# Patient Record
Sex: Male | Born: 1970
Health system: Southern US, Community
[De-identification: ages and names within clinical notes are randomized; demographics above are authoritative.]

## PROBLEM LIST (undated history)

## (undated) DIAGNOSIS — I10 Essential (primary) hypertension: Secondary | ICD-10-CM

## (undated) DIAGNOSIS — E785 Hyperlipidemia, unspecified: Secondary | ICD-10-CM

## (undated) HISTORY — DX: Essential (primary) hypertension: I10

## (undated) HISTORY — DX: Hyperlipidemia, unspecified: E78.5

## (undated) HISTORY — PX: NO PAST SURGERIES: SHX2092

---

## 1998-07-21 ENCOUNTER — Ambulatory Visit (HOSPITAL_COMMUNITY): Admission: RE | Admit: 1998-07-21 | Discharge: 1998-07-21 | Payer: Self-pay | Admitting: Obstetrics & Gynecology

## 1998-08-20 ENCOUNTER — Ambulatory Visit (HOSPITAL_COMMUNITY): Admission: RE | Admit: 1998-08-20 | Discharge: 1998-08-20 | Payer: Self-pay | Admitting: *Deleted

## 1998-09-18 ENCOUNTER — Ambulatory Visit (HOSPITAL_COMMUNITY): Admission: RE | Admit: 1998-09-18 | Discharge: 1998-09-18 | Payer: Self-pay | Admitting: *Deleted

## 2007-09-24 DIAGNOSIS — I10 Essential (primary) hypertension: Secondary | ICD-10-CM | POA: Insufficient documentation

## 2009-06-13 ENCOUNTER — Ambulatory Visit: Payer: Self-pay | Admitting: Gastroenterology

## 2009-07-26 DIAGNOSIS — E7800 Pure hypercholesterolemia, unspecified: Secondary | ICD-10-CM | POA: Insufficient documentation

## 2010-07-26 ENCOUNTER — Ambulatory Visit: Payer: Self-pay | Admitting: Internal Medicine

## 2012-06-03 ENCOUNTER — Ambulatory Visit: Payer: Self-pay

## 2012-06-03 ENCOUNTER — Ambulatory Visit: Payer: Self-pay | Admitting: Family Medicine

## 2012-07-23 ENCOUNTER — Ambulatory Visit: Payer: Self-pay | Admitting: Emergency Medicine

## 2012-07-23 LAB — RAPID INFLUENZA A&B ANTIGENS

## 2012-09-23 ENCOUNTER — Ambulatory Visit: Payer: Self-pay | Admitting: Emergency Medicine

## 2013-03-21 ENCOUNTER — Ambulatory Visit: Payer: Self-pay | Admitting: Physician Assistant

## 2013-08-12 ENCOUNTER — Ambulatory Visit: Payer: Self-pay | Admitting: Physician Assistant

## 2013-08-26 LAB — FECAL OCCULT BLOOD, GUAIAC: Fecal Occult Blood: NEGATIVE

## 2013-11-11 ENCOUNTER — Ambulatory Visit: Payer: Self-pay | Admitting: Family Medicine

## 2014-09-14 LAB — BASIC METABOLIC PANEL
BUN: 14 mg/dL (ref 4–21)
Creatinine: 1.3 mg/dL (ref 0.6–1.3)
GLUCOSE: 114 mg/dL
POTASSIUM: 5 mmol/L (ref 3.4–5.3)
Sodium: 143 mmol/L (ref 137–147)

## 2014-09-14 LAB — CBC AND DIFFERENTIAL
HEMATOCRIT: 47 % (ref 41–53)
Hemoglobin: 16.4 g/dL (ref 13.5–17.5)
Platelets: 307 10*3/uL (ref 150–399)
WBC: 6.7 10^3/mL

## 2014-09-14 LAB — LIPID PANEL
Cholesterol: 202 mg/dL — AB (ref 0–200)
HDL: 34 mg/dL — AB (ref 35–70)
LDL Cholesterol: 136 mg/dL
Triglycerides: 160 mg/dL (ref 40–160)

## 2014-09-14 LAB — HEPATIC FUNCTION PANEL
ALT: 33 U/L (ref 10–40)
AST: 28 U/L (ref 14–40)
Alkaline Phosphatase: 59 U/L (ref 25–125)
BILIRUBIN, TOTAL: 0.4 mg/dL

## 2014-09-14 LAB — TSH: TSH: 1.86 u[IU]/mL (ref 0.41–5.90)

## 2014-10-14 ENCOUNTER — Ambulatory Visit: Payer: Self-pay | Admitting: Family Medicine

## 2015-09-06 ENCOUNTER — Encounter: Payer: Self-pay | Admitting: Family Medicine

## 2015-09-07 DIAGNOSIS — Z87442 Personal history of urinary calculi: Secondary | ICD-10-CM | POA: Insufficient documentation

## 2015-09-13 ENCOUNTER — Encounter: Payer: Self-pay | Admitting: Family Medicine

## 2015-09-13 ENCOUNTER — Ambulatory Visit (INDEPENDENT_AMBULATORY_CARE_PROVIDER_SITE_OTHER): Payer: BLUE CROSS/BLUE SHIELD | Admitting: Family Medicine

## 2015-09-13 VITALS — BP 128/82 | HR 80 | Temp 98.9°F | Resp 16 | Ht 68.0 in | Wt 217.0 lb

## 2015-09-13 DIAGNOSIS — R195 Other fecal abnormalities: Secondary | ICD-10-CM

## 2015-09-13 DIAGNOSIS — Z0000 Encounter for general adult medical examination without abnormal findings: Secondary | ICD-10-CM

## 2015-09-13 DIAGNOSIS — E7800 Pure hypercholesterolemia, unspecified: Secondary | ICD-10-CM | POA: Diagnosis not present

## 2015-09-13 DIAGNOSIS — Z87442 Personal history of urinary calculi: Secondary | ICD-10-CM | POA: Diagnosis not present

## 2015-09-13 DIAGNOSIS — Z23 Encounter for immunization: Secondary | ICD-10-CM | POA: Diagnosis not present

## 2015-09-13 DIAGNOSIS — Z8249 Family history of ischemic heart disease and other diseases of the circulatory system: Secondary | ICD-10-CM | POA: Diagnosis not present

## 2015-09-13 NOTE — Patient Instructions (Signed)

## 2015-09-13 NOTE — Progress Notes (Signed)
Patient ID: Austin Sickles., Austin Bradford   DOB: 1970-11-15, 45 y.o.   MRN: [id number]       Patient: Austin Lorentz., Austin Bradford    DOB: [birth year], 45 y.o.   MRN: [id number] Visit Date: 09/13/2015  Today's Provider: Vernie Murders, PA    Subjective:    Annual physical exam Austin Goya Coe Brooke Bonito. is a 45 y.o. Austin Bradford who presents today for health maintenance and complete physical. He feels well. He reports exercising occasionally. He works out 3 days a week. He reports he is sleeping well. History of hyperlipidemia but has been off the statin for the past several months. Denies side effects of muscle pains. Last: Tdap- 10/14/2005   Review of Systems  Constitutional: Negative.   HENT: Negative.   Eyes: Negative.   Respiratory: Negative.   Cardiovascular: Negative.   Gastrointestinal: Negative.   Endocrine: Negative.   Musculoskeletal: Positive for back pain.  Skin: Negative.   Allergic/Immunologic: Negative.   Neurological: Negative.   Hematological: Negative.   Psychiatric/Behavioral: Negative.     Social History      He  reports that he has never smoked. He has never used smokeless tobacco. He reports that he drinks alcohol. He reports that he does not use illicit drugs.       Social History   Social History  . Marital Status: Married    Spouse Name: N/A  . Number of Children: N/A  . Years of Education: N/A   Social History Main Topics  . Smoking status: Never Smoker   . Smokeless tobacco: Never Used  . Alcohol Use: 0.0 oz/week    0 Standard drinks or equivalent per week     Comment: occasionally  . Drug Use: No  . Sexual Activity: Not Asked   Other Topics Concern  . None   Social History Narrative    Past Medical History  Diagnosis Date  . Hyperlipidemia   . Hypertension      Patient Active Problem List   Diagnosis Date Noted  . H/O renal calculi 09/07/2015  . Hypercholesterolemia without hypertriglyceridemia 07/26/2009  . Essential  (primary) hypertension 09/24/2007    Past Surgical History  Procedure Laterality Date  . No past surgeries      Family History        Family Status  Relation Status Death Age  . Mother Alive   . Father Deceased   . Sister Alive   . Maternal Grandmother Alive   . Maternal Grandfather Alive   . Paternal Grandmother Deceased         His family history includes Breast cancer in his mother; CAD in his maternal grandfather; CVA in his maternal grandmother; Healthy in his sister; Heart disease in his father; Hyperlipidemia in his father; Hypertension in his father; Ovarian cancer in his paternal grandmother; Pancreatitis in his father.    No Known Allergies  Previous Medications   PRAVASTATIN (PRAVACHOL) 20 MG TABLET    Take by mouth. Reported on 09/13/2015    Patient Care Team: Margo Common, PA as PCP - General (Family Medicine)     Objective:   Vitals: BP 128/82 mmHg  Pulse 80  Temp(Src) 98.9 F (37.2 C)  Resp 16  Ht _0  (1.727 m)  Wt 217 lb (98.431 kg)  BMI 33.00 kg/m2    Physical Exam  Constitutional: He is oriented to person, place, and time. He appears well-developed and well-nourished.  HENT:  Head: Normocephalic and atraumatic.  Right Ear: External  ear normal.  Left Ear: External ear normal.  Nose: Nose normal.  Mouth/Throat: Oropharynx is clear and moist.  Eyes: Conjunctivae and EOM are normal. Pupils are equal, round, and reactive to light. Right eye exhibits no discharge.  Neck: Normal range of motion. Neck supple. No tracheal deviation present. No thyromegaly present.  Cardiovascular: Normal rate, regular rhythm, normal heart sounds and intact distal pulses.   No murmur heard. Pulmonary/Chest: Effort normal and breath sounds normal. No respiratory distress. He has no wheezes. He has no rales. He exhibits no tenderness.  Abdominal: Soft. He exhibits no distension and no mass. There is no tenderness. There is no rebound and no guarding.  Genitourinary:  Prostate normal and penis normal. Guaiac positive stool.  Musculoskeletal: Normal range of motion. He exhibits no edema or tenderness.  Lymphadenopathy:    He has no cervical adenopathy.  Neurological: He is alert and oriented to person, place, and time. He has normal reflexes. No cranial nerve deficit. He exhibits normal muscle tone. Coordination normal.  Skin: Skin is warm and dry. No rash noted. No erythema.  Multiple brown freckles on back with a couple of pink to tan lesion 1 cm  In diameter left lower lumbar and mid upper lumbar regions. (Dermatologist is planning excisions).  Psychiatric: He has a normal mood and affect. His behavior is normal. Judgment and thought content normal.   Depression Screen PHQ 2/9 Scores 09/13/2015  PHQ - 2 Score 0    Assessment & Plan:     Routine Health Maintenance and Physical Exam  Exercise Activities and Dietary recommendations Goals    Continue to go to the gym for an hour 3 times a week to use treadmill and lift weights. Encouraged to follow low fat diet and work on some weight loss.      Immunization History  Administered Date(s) Administered  . Tdap 10/14/2005        Discussed health benefits of physical activity, and encouraged him to engage in regular exercise appropriate for his age and condition.    -------------------------------------------------------------------- 1. Annual physical exam Good general health. Will up date Tdap. Given anticipatory guidance regarding future shingles and pneumonia vaccinations. Also, advised normal schedule for colonoscopy is at age 52.  - EKG 12-Lead  2. Hypercholesterolemia without hypertriglyceridemia Has been off the Pravastatin for several months. Denied having any side effects. Continues to exercise 3 times a week. Encouraged to continue low fat diet and try to lose some weight. Recheck labs. - COMPLETE METABOLIC PANEL WITH GFR - Lipid panel - TSH  3. H/O renal calculi Normal urinalysis  today. Rare back ache but no evidence of hematuria recently. Will recheck renal function by CMP. - POCT urinalysis dipstick  4. Family history of MI (myocardial infarction) Father died at age 33 of MI in 23-Sep-2014. This patient's EKG normal without signs of ischemic disease. Will recheck lipids. - EKG 12-Lead  5. Fecal occult blood test positive Denies discomfort with BM or constipation. Occasionally may see some blood on tissue. Will give OC-Light kit to retest at home to confirm Hemoccult results. May need referral for colonoscopy if positive. - CBC with Differential/Platelet  6. Need for Tdap vaccination - Tdap vaccine greater than or equal to 7yo IM

## 2015-09-14 LAB — LIPID PANEL
Chol/HDL Ratio: 7.3 ratio units — ABNORMAL HIGH (ref 0.0–5.0)
Cholesterol, Total: 204 mg/dL — ABNORMAL HIGH (ref 100–199)
HDL: 28 mg/dL — ABNORMAL LOW (ref >39)
LDL CALC: 128 mg/dL — AB (ref 0–99)
Triglycerides: 238 mg/dL — ABNORMAL HIGH (ref 0–149)
VLDL CHOLESTEROL CAL: 48 mg/dL — AB (ref 5–40)

## 2015-09-14 LAB — CBC WITH DIFFERENTIAL/PLATELET
BASOS ABS: 0 10*3/uL (ref 0.0–0.2)
Basos: 0 %
EOS (ABSOLUTE): 0.1 10*3/uL (ref 0.0–0.4)
Eos: 1 %
Hematocrit: 47.2 % (ref 37.5–51.0)
Hemoglobin: 16.4 g/dL (ref 12.6–17.7)
IMMATURE GRANS (ABS): 0 10*3/uL (ref 0.0–0.1)
IMMATURE GRANULOCYTES: 1 %
LYMPHS: 27 %
Lymphocytes Absolute: 2 10*3/uL (ref 0.7–3.1)
MCH: 29.2 pg (ref 26.6–33.0)
MCHC: 34.7 g/dL (ref 31.5–35.7)
MCV: 84 fL (ref 79–97)
Monocytes Absolute: 0.5 10*3/uL (ref 0.1–0.9)
Monocytes: 7 %
NEUTROS PCT: 64 %
Neutrophils Absolute: 4.6 10*3/uL (ref 1.4–7.0)
Platelets: 291 10*3/uL (ref 150–379)
RBC: 5.61 x10E6/uL (ref 4.14–5.80)
RDW: 12.3 % (ref 12.3–15.4)
WBC: 7.3 10*3/uL (ref 3.4–10.8)

## 2015-09-14 LAB — COMPREHENSIVE METABOLIC PANEL
ALK PHOS: 57 IU/L (ref 39–117)
ALT: 17 IU/L (ref 0–44)
AST: 15 IU/L (ref 0–40)
Albumin/Globulin Ratio: 1.6 (ref 1.1–2.5)
Albumin: 4.5 g/dL (ref 3.5–5.5)
BILIRUBIN TOTAL: 0.4 mg/dL (ref 0.0–1.2)
BUN/Creatinine Ratio: 9 (ref 9–20)
BUN: 10 mg/dL (ref 6–24)
CHLORIDE: 102 mmol/L (ref 96–106)
CO2: 27 mmol/L (ref 18–29)
Calcium: 9.8 mg/dL (ref 8.7–10.2)
Creatinine, Ser: 1.1 mg/dL (ref 0.76–1.27)
GFR calc Af Amer: 94 mL/min/{1.73_m2} (ref >59)
GFR calc non Af Amer: 81 mL/min/{1.73_m2} (ref >59)
GLUCOSE: 110 mg/dL — AB (ref 65–99)
Globulin, Total: 2.9 g/dL (ref 1.5–4.5)
Potassium: 4.5 mmol/L (ref 3.5–5.2)
Sodium: 142 mmol/L (ref 134–144)
Total Protein: 7.4 g/dL (ref 6.0–8.5)

## 2015-09-14 LAB — TSH: TSH: 2.7 u[IU]/mL (ref 0.450–4.500)

## 2015-09-20 ENCOUNTER — Telehealth: Payer: Self-pay

## 2015-09-20 NOTE — Telephone Encounter (Signed)
-----   Message from Jodell Cipro [place], [place] sent at 09/20/2015 11:55 AM EST ----- All blood tests essentially normal except cholesterol and triglyceride levels higher than last year. Definitely need statin (Simvastatin 20 mg qd #30 & 3 RF). Recheck blood levels in 3 months.

## 2015-09-20 NOTE — Telephone Encounter (Signed)
LMTCB

## 2015-09-21 NOTE — Telephone Encounter (Signed)
Pt returned call. Thanks TNP °

## 2015-09-21 NOTE — Telephone Encounter (Signed)
Pt returned you call.    ° °Thank sTeri °

## 2015-09-21 NOTE — Telephone Encounter (Signed)
Attempted to contact patient. No answer nor voicemail.  

## 2015-09-25 MED ORDER — SIMVASTATIN 20 MG PO TABS: 20.0000 mg | ORAL_TABLET | Freq: Every day | ORAL | Status: DC

## 2015-09-25 NOTE — Telephone Encounter (Signed)
Pt is returning call.  CB#[phone]/MW

## 2015-09-25 NOTE — Telephone Encounter (Signed)
Patient advised as directed below. Patient verbalized understanding and agrees with plan of care. RX sent to pharmacy. Patient scheduled for a 3 month follow up appointment.

## 2015-11-26 ENCOUNTER — Ambulatory Visit (INDEPENDENT_AMBULATORY_CARE_PROVIDER_SITE_OTHER): Payer: BLUE CROSS/BLUE SHIELD | Admitting: Physician Assistant

## 2015-11-26 ENCOUNTER — Other Ambulatory Visit: Payer: Self-pay

## 2015-11-26 ENCOUNTER — Encounter: Payer: Self-pay | Admitting: Physician Assistant

## 2015-11-26 VITALS — BP 140/90 | HR 115 | Temp 98.2°F | Resp 16 | Wt 216.4 lb

## 2015-11-26 DIAGNOSIS — K429 Umbilical hernia without obstruction or gangrene: Secondary | ICD-10-CM

## 2015-11-26 DIAGNOSIS — K648 Other hemorrhoids: Secondary | ICD-10-CM

## 2015-11-26 DIAGNOSIS — R195 Other fecal abnormalities: Secondary | ICD-10-CM | POA: Diagnosis not present

## 2015-11-26 DIAGNOSIS — R1032 Left lower quadrant pain: Secondary | ICD-10-CM | POA: Diagnosis not present

## 2015-11-26 LAB — POCT URINALYSIS DIPSTICK
BILIRUBIN UA: NEGATIVE
Blood, UA: NEGATIVE
GLUCOSE UA: NEGATIVE
KETONES UA: NEGATIVE
LEUKOCYTES UA: NEGATIVE
Nitrite, UA: NEGATIVE
Protein, UA: NEGATIVE
Spec Grav, UA: <=1.005
Urobilinogen, UA: 0.2
pH, UA: 6.5

## 2015-11-26 LAB — IFOBT (OCCULT BLOOD): IMMUNOLOGICAL FECAL OCCULT BLOOD TEST: POSITIVE

## 2015-11-26 MED ORDER — HYDROCORTISONE ACETATE 25 MG RE SUPP: 25.0000 mg | Freq: Two times a day (BID) | RECTAL | Status: DC

## 2015-11-26 NOTE — Patient Instructions (Addendum)
Hernia, Adult A hernia is the bulging of an organ or tissue through a weak spot in the muscles of the abdomen (abdominal wall). Hernias develop most often near the navel or groin. There are many kinds of hernias. Common kinds include:  Femoral hernia. This kind of hernia develops under the groin in the upper thigh area.  Inguinal hernia. This kind of hernia develops in the groin or scrotum.  Umbilical hernia. This kind of hernia develops near the navel.  Hiatal hernia. This kind of hernia causes part of the stomach to be pushed up into the chest.  Incisional hernia. This kind of hernia bulges through a scar from an abdominal surgery. CAUSES This condition may be caused by:  Heavy lifting.  Coughing over a long period of time.  Straining to have a bowel movement.  An incision made during an abdominal surgery.  A birth defect (congenital defect).  Excess weight or obesity.  Smoking.  Poor nutrition.  Cystic fibrosis.  Excess fluid in the abdomen.  Undescended testicles. SYMPTOMS Symptoms of a hernia include:  A lump on the abdomen. This is the first sign of a hernia. The lump may become more obvious with standing, straining, or coughing. It may get bigger over time if it is not treated or if the condition causing it is not treated.  Pain. A hernia is usually painless, but it may become painful over time if treatment is delayed. The pain is usually dull and may get worse with standing or lifting heavy objects. Sometimes a hernia gets tightly squeezed in the weak spot (strangulated) or stuck there (incarcerated) and causes additional symptoms. These symptoms may include:  Vomiting.  Nausea.  Constipation.  Irritability. DIAGNOSIS A hernia may be diagnosed with:  A physical exam. During the exam your health care provider may ask you to cough or to make a specific movement, because a hernia is usually more visible when you move.  Imaging tests. These can  include:  X-rays.  Ultrasound.  CT scan. TREATMENT A hernia that is small and painless may not need to be treated. A hernia that is large or painful may be treated with surgery. Inguinal hernias may be treated with surgery to prevent incarceration or strangulation. Strangulated hernias are always treated with surgery, because lack of blood to the trapped organ or tissue can cause it to die. Surgery to treat a hernia involves pushing the bulge back into place and repairing the weak part of the abdomen. HOME CARE INSTRUCTIONS  Avoid straining.  Do not lift anything heavier than 10 lb (4.5 kg).  Lift with your leg muscles, not your back muscles. This helps avoid strain.  When coughing, try to cough gently.  Prevent constipation. Constipation leads to straining with bowel movements, which can make a hernia worse or cause a hernia repair to break down. You can prevent constipation by:  Eating a high-fiber diet that includes plenty of fruits and vegetables.  Drinking enough fluids to keep your urine clear or pale yellow. Aim to drink 6-8 glasses of water per day.  Using a stool softener as directed by your health care provider.  Lose weight, if you are overweight.  Do not use any tobacco products, including cigarettes, chewing tobacco, or electronic cigarettes. If you need help quitting, ask your health care provider.  Keep all follow-up visits as directed by your health care provider. This is important. Your health care provider may need to monitor your condition. SEEK MEDICAL CARE IF:  You have   swelling, redness, and pain in the affected area.  Your bowel habits change. SEEK IMMEDIATE MEDICAL CARE IF:  You have a fever.  You have abdominal pain that is getting worse.  You feel nauseous or you vomit.  You cannot push the hernia back in place by gently pressing on it while you are lying down.  The hernia:  Changes in shape or size.  Is stuck outside the  abdomen.  Becomes discolored.  Feels hard or tender.   This information is not intended to replace advice given to you by your health care provider. Make sure you discuss any questions you have with your health care provider.   Document Released: 07/21/2005 Document Revised: 08/11/2014 Document Reviewed: 05/31/2014 Elsevier Interactive Patient Education 2016 [employer]. Hemorrhoids Hemorrhoids are swollen veins around the rectum or anus. There are two types of hemorrhoids:   Internal hemorrhoids. These occur in the veins just inside the rectum. They may poke through to the outside and become irritated and painful.  External hemorrhoids. These occur in the veins outside the anus and can be felt as a painful swelling or hard lump near the anus. CAUSES  Pregnancy.   Obesity.   Constipation or diarrhea.   Straining to have a bowel movement.   Sitting for long periods on the toilet.  Heavy lifting or other activity that caused you to strain.  Anal intercourse. SYMPTOMS   Pain.   Anal itching or irritation.   Rectal bleeding.   Fecal leakage.   Anal swelling.   One or more lumps around the anus.  DIAGNOSIS  Your caregiver may be able to diagnose hemorrhoids by visual examination. Other examinations or tests that may be performed include:   Examination of the rectal area with a gloved hand (digital rectal exam).   Examination of anal canal using a small tube (scope).   A blood test if you have lost a significant amount of blood.  A test to look inside the colon (sigmoidoscopy or colonoscopy). TREATMENT Most hemorrhoids can be treated at home. However, if symptoms do not seem to be getting better or if you have a lot of rectal bleeding, your caregiver may perform a procedure to help make the hemorrhoids get smaller or remove them completely. Possible treatments include:   Placing a rubber band at the base of the hemorrhoid to cut off the circulation  (rubber band ligation).   Injecting a chemical to shrink the hemorrhoid (sclerotherapy).   Using a tool to burn the hemorrhoid (infrared light therapy).   Surgically removing the hemorrhoid (hemorrhoidectomy).   Stapling the hemorrhoid to block blood flow to the tissue (hemorrhoid stapling).  HOME CARE INSTRUCTIONS   Eat foods with fiber, such as whole grains, beans, nuts, fruits, and vegetables. Ask your doctor about taking products with added fiber in them (fibersupplements).  Increase fluid intake. Drink enough water and fluids to keep your urine clear or pale yellow.   Exercise regularly.   Go to the bathroom when you have the urge to have a bowel movement. Do not wait.   Avoid straining to have bowel movements.   Keep the anal area dry and clean. Use wet toilet paper or moist towelettes after a bowel movement.   Medicated creams and suppositories may be used or applied as directed.   Only take over-the-counter or prescription medicines as directed by your caregiver.   Take warm sitz baths for 15-20 minutes, 3-4 times a day to ease pain and discomfort.   Place  ice packs on the hemorrhoids if they are tender and swollen. Using ice packs between sitz baths may be helpful.   Put ice in a plastic bag.   Place a towel between your skin and the bag.   Leave the ice on for 15-20 minutes, 3-4 times a day.   Do not use a donut-shaped pillow or sit on the toilet for long periods. This increases blood pooling and pain.  SEEK MEDICAL CARE IF:  You have increasing pain and swelling that is not controlled by treatment or medicine.  You have uncontrolled bleeding.  You have difficulty or you are unable to have a bowel movement.  You have pain or inflammation outside the area of the hemorrhoids. MAKE SURE YOU:  Understand these instructions.  Will watch your condition.  Will get help right away if you are not doing well or get worse.   This information is  not intended to replace advice given to you by your health care provider. Make sure you discuss any questions you have with your health care provider.   Document Released: 07/18/2000 Document Revised: 07/07/2012 Document Reviewed: 05/25/2012 Elsevier Interactive Patient Education 2016 Else[employer] Inc[employer] Hydrocortisone suppositories What is this medicine? HYDROCORTISONE (hye droe KOR ti sone) is a corticosteroid. It is used to decrease swelling, itching, and pain that is caused by minor skin irritations or by hemorrhoids. This medicine may be used for other purposes; ask your health care provider or pharmacist if you have questions. What should I tell my health care provider before I take this medicine? They need to know if you have any of these conditions: -an unusual or allergic reaction to hydrocortisone, corticosteroids, other medicines, foods, dyes, or preservatives -pregnant or trying to get pregnant -breast-feeding How should I use this medicine? This medicine is for rectal use only. Do not take by mouth. Wash your hands before and after use. Take off the foil wrapping. Wet the tip of the suppository with cold tap water to make it easier to use. Lie on your side with your lower leg straightened out and your upper leg bent forward toward your stomach. Lift upper buttock to expose the rectal area. Apply gentle pressure to insert the suppository completely into the rectum, pointed end first. Hold buttocks together for a few seconds. Remain lying down for about 15 minutes to avoid having the suppository come out. Do not use more often than directed. Talk to your pediatrician regarding the use of this medicine in children. Special care may be needed. Overdosage: If you think you have taken too much of this medicine contact a poison control center or emergency room at once. NOTE: This medicine is only for you. Do not share this medicine with others. What if I miss a dose? If you miss a dose, use it as  soon as you can. If it is almost time for your next dose, use only that dose. Do not use double or extra doses. What may interact with this medicine? Interactions are not expected. Do not use any other rectal products on the affected area without telling your doctor or health care professional. This list may not describe all possible interactions. Give your health care provider a list of all the medicines, herbs, non-prescription drugs, or dietary supplements you use. Also tell them if you smoke, drink alcohol, or use illegal drugs. Some items may interact with your medicine. What should I watch for while using this medicine? Visit your doctor or health care professional for regular checks  on your progress. Tell your doctor or health care professional if your symptoms do not improve after a few days of use. Do not use if there is blood in your stools. If you get any type of infection while using this medicine, you may need to stop using this medicine until our infections clears up. Ask your doctor or health care professional for advice. What side effects may I notice from receiving this medicine? Side effects that you should report to your doctor or health care professional as soon as possible: -bloody or black, tarry stools -painful, red, pus filled blisters in hair follicles -rectal pain, burning or bleeding after use of medicine Side effects that usually do not require medical attention (report to your doctor or health care professional if they continue or are bothersome): -changes in skin color -dry skin -itching or irritation This list may not describe all possible side effects. Call your doctor for medical advice about side effects. You may report side effects to FDA at 1-800-FDA-1088. Where should I keep my medicine? Keep out of the reach of children. Store at room temperature between 20 and 25 degrees C (68 and 77 degrees F). Protect from heat and freezing. Throw away any unused medicine  after the expiration date. NOTE: This sheet is a summary. It may not cover all possible information. If you have questions about this medicine, talk to your doctor, pharmacist, or health care provider.    2016, Elsevier/Gold Standard. (2007-12-03 16:07:24)

## 2015-11-26 NOTE — Progress Notes (Signed)
Patient: [employer] Male    DOB: 07/06/1971   45 y.o.   MRN: [id number] Visit Date: 11/26/2015  Today's Provider: Margaretann Loveless, PA-C   Chief Complaint  Patient presents with  . Abdominal Pain   Subjective:    Abdominal Pain This is a new problem. The current episode started in the past 7 days (over the weekend). The onset quality is sudden. The problem occurs constantly. The problem has been unchanged. The pain is located in the periumbilical region and LLQ (when he bends over is most painful). The pain is mild (it feels more when he push on his stomach). The quality of the pain is dull. Associated symptoms include belching. Pertinent negatives include no constipation, diarrhea (Loose stool), dysuria, fever, headaches, hematuria, nausea or vomiting. The pain is aggravated by belching and certain positions. The pain is relieved by nothing. Treatments tried: Ibuprofen. The treatment provided no relief. There is no history of abdominal surgery, colon cancer, Crohn's disease, gallstones, GERD, irritable bowel syndrome, pancreatitis, PUD or ulcerative colitis.   States that over the weekend he was playing golf and noticed developing increasing abdominal pain just superior to the umbilicus that radiated to LLQ. He did have some back pain as well and thought it may be a kidney stone as he does have a history of these. No urinary symptoms however. No fever. He then noticed this morning he was bent over and had a more severe pain just above the umbilicus and felt a small mass there when he stood up. He massaged the area and it went away as did the pain. He took an IBU and has had no pain since. He has not felt the mass since either. He has never had a colonoscopy.  Of note: at his CPE in 09/2015 he did have a positive fecal occult test with stable hemoglobin. He was given a repeat test to do at home that he never did.      No Known Allergies Previous Medications   PRAVASTATIN (PRAVACHOL) 20 MG TABLET    Take by mouth. Reported on 09/13/2015   SIMVASTATIN (ZOCOR) 20 MG TABLET    Take 1 tablet (20 mg total) by mouth at bedtime.    Review of Systems  Constitutional: Negative for fever, chills, activity change and appetite change.  Respiratory: Negative for cough, chest tightness and shortness of breath.   Cardiovascular: Negative for chest pain.  Gastrointestinal: Positive for abdominal pain. Negative for nausea, vomiting, diarrhea (Loose stool) and constipation.  Genitourinary: Negative for dysuria and hematuria.  Neurological: Negative for dizziness and headaches.    Social History  Substance Use Topics  . Smoking status: Never Smoker   . Smokeless tobacco: Never Used  . Alcohol Use: 0.0 oz/week    0 Standard drinks or equivalent per week     Comment: occasionally   Objective:   BP 140/90 mmHg  Pulse 115  Temp(Src) 98.2 F (36.8 C) (Oral)  Resp 16  Wt 216 lb 6.4 oz (98.158 kg)  Physical Exam  Constitutional: He is oriented to person, place, and time. He appears well-developed and well-nourished. No distress.  Cardiovascular: Normal rate, regular rhythm and normal heart sounds.  Exam reveals no gallop and no friction rub.   No murmur heard. Pulmonary/Chest: Effort normal and breath sounds normal. No respiratory distress. He has no wheezes. He has no rales.  Abdominal: Soft. Normal appearance and bowel sounds are normal. He exhibits no distension and no  mass. There is no hepatosplenomegaly. There is tenderness in the periumbilical area and left lower quadrant. There is no rebound, no guarding and no CVA tenderness.    Neurological: He is alert and oriented to person, place, and time.  Skin: Skin is warm and dry. He is not diaphoretic.  Vitals reviewed.       Assessment & Plan:     1. LLQ pain UA was negative. Fecal occult was again positive in the office. DDx: diverticulitis, internal hemorrhoids with irritation, renal stone. Will  check labs as below and f/u pending lab results. Advised of proper lifting and braces for what I believe to have been a reduced umbilical hernia. Discussed adding fiber and will give anusol-HC for possible internal hemorrhoid. I did discuss with Dortha Kernennis Chrismon, PA-C (Patient's PCP) and he agrees with GI referral for positive fecal occult x 2. He is to call if mass returns, abdominal pain or rectal bleeding worsen. - POCT Urinalysis Dipstick - CBC with Differential - Basic Metabolic Panel (BMET) - C-reactive protein - IFOBT POC (occult bld, rslt in office) - Ambulatory referral to Gastroenterology  2. Positive fecal occult blood test See above medical treatment plan. - Ambulatory referral to Gastroenterology  3. Internal hemorrhoid See above medical treatment plan. - hydrocortisone (ANUSOL-HC) 25 MG suppository; Place 1 suppository (25 mg total) rectally 2 (two) times daily.  Dispense: 12 suppository; Refill: 3  4. Umbilical hernia without obstruction and without gangrene See above medical treatment plan. Will monitor.       Margaretann LovelessJennifer M Dennisha Mouser, PA-C  [facility]

## 2015-11-27 LAB — BASIC METABOLIC PANEL
BUN/Creatinine Ratio: 9 (ref 9–20)
BUN: 9 mg/dL (ref 6–24)
CO2: 25 mmol/L (ref 18–29)
CREATININE: 1 mg/dL (ref 0.76–1.27)
Calcium: 9.9 mg/dL (ref 8.7–10.2)
Chloride: 97 mmol/L (ref 96–106)
GFR calc Af Amer: 105 mL/min/{1.73_m2} (ref >59)
GFR, EST NON AFRICAN AMERICAN: 90 mL/min/{1.73_m2} (ref >59)
Glucose: 115 mg/dL — ABNORMAL HIGH (ref 65–99)
POTASSIUM: 4.4 mmol/L (ref 3.5–5.2)
Sodium: 140 mmol/L (ref 134–144)

## 2015-11-27 LAB — CBC WITH DIFFERENTIAL/PLATELET
Basophils Absolute: 0 10*3/uL (ref 0.0–0.2)
Basos: 0 %
EOS (ABSOLUTE): 0 10*3/uL (ref 0.0–0.4)
EOS: 0 %
HEMATOCRIT: 48.2 % (ref 37.5–51.0)
HEMOGLOBIN: 17.2 g/dL (ref 12.6–17.7)
Immature Grans (Abs): 0 10*3/uL (ref 0.0–0.1)
Immature Granulocytes: 0 %
LYMPHS ABS: 1.5 10*3/uL (ref 0.7–3.1)
Lymphs: 21 %
MCH: 29.3 pg (ref 26.6–33.0)
MCHC: 35.7 g/dL (ref 31.5–35.7)
MCV: 82 fL (ref 79–97)
MONOCYTES: 10 %
MONOS ABS: 0.7 10*3/uL (ref 0.1–0.9)
NEUTROS ABS: 4.9 10*3/uL (ref 1.4–7.0)
Neutrophils: 69 %
Platelets: 293 10*3/uL (ref 150–379)
RBC: 5.88 x10E6/uL — AB (ref 4.14–5.80)
RDW: 12.2 % — AB (ref 12.3–15.4)
WBC: 7.1 10*3/uL (ref 3.4–10.8)

## 2015-11-27 LAB — C-REACTIVE PROTEIN: CRP: 0.7 mg/L (ref 0.0–4.9)

## 2015-11-28 ENCOUNTER — Ambulatory Visit
Admission: EM | Admit: 2015-11-28 | Discharge: 2015-11-28 | Disposition: A | Discharge status: Home / Self Care | Payer: BLUE CROSS/BLUE SHIELD | Attending: Family Medicine | Admitting: Family Medicine

## 2015-11-28 ENCOUNTER — Encounter: Payer: Self-pay | Admitting: Emergency Medicine

## 2015-11-28 DIAGNOSIS — R350 Frequency of micturition: Secondary | ICD-10-CM

## 2015-11-28 LAB — URINALYSIS COMPLETE WITH MICROSCOPIC (ARMC ONLY)
BILIRUBIN URINE: NEGATIVE
Bacteria, UA: NONE SEEN
Glucose, UA: NEGATIVE mg/dL
HGB URINE DIPSTICK: NEGATIVE
LEUKOCYTES UA: NEGATIVE
NITRITE: NEGATIVE
PH: 7 (ref 5.0–8.0)
Protein, ur: NEGATIVE mg/dL
SQUAMOUS EPITHELIAL / LPF: NONE SEEN
Specific Gravity, Urine: 1.01 (ref 1.005–1.030)

## 2015-11-28 LAB — CHLAMYDIA/NGC RT PCR (ARMC ONLY)
Chlamydia Tr: NOT DETECTED
N GONORRHOEAE: NOT DETECTED

## 2015-11-28 MED ORDER — AZITHROMYCIN 500 MG PO TABS
1000.0000 mg | ORAL_TABLET | Freq: Once | ORAL | Status: AC
  Administered 2015-11-28: 1000 mg via ORAL

## 2015-11-28 NOTE — ED Provider Notes (Signed)
CSN: [id number]     Arrival date & time 11/28/15  0846 History   First MD Initiated Contact with Patient 11/28/15 [phone]     Chief Complaint  Patient presents with  . Back Pain   (Consider location/radiation/quality/duration/timing/severity/associated sxs/prior Treatment) HPI Comments: 45 yo male with a 1 month h/o intermittent abdominal pains, discomfort with urination (pressure) and frequent urination. Denies any fevers, chills, vomiting, diarrhea, hematuria, burning with urination, penile discharge. States he would like to get tested for STDs.   The history is provided by the patient.    Past Medical History  Diagnosis Date  . Hyperlipidemia   . Hypertension    Past Surgical History  Procedure Laterality Date  . No past surgeries     Family History  Problem Relation Age of Onset  . Breast cancer Mother     history of  . Hyperlipidemia Father   . Hypertension Father   . Pancreatitis Father   . Heart disease Father   . Healthy Sister   . CVA Maternal Grandmother   . CAD Maternal Grandfather   . Ovarian cancer Paternal Grandmother    Social History  Substance Use Topics  . Smoking status: Never Smoker   . Smokeless tobacco: Never Used  . Alcohol Use: 0.0 oz/week    0 Standard drinks or equivalent per week     Comment: occasionally    Review of Systems  Allergies  Review of patient's allergies indicates no known allergies.  Home Medications   Prior to Admission medications   Medication Sig Start Date End Date Taking? Authorizing Provider  hydrocortisone (ANUSOL-HC) 25 MG suppository Place 1 suppository (25 mg total) rectally 2 (two) times daily. 11/26/15   Margaretann LovelessJennifer M Burnette, PA-C  pravastatin (PRAVACHOL) 20 MG tablet Take by mouth. Reported on 09/13/2015 09/19/14   Historical Provider, MD   Meds Ordered and Administered this Visit   Medications  azithromycin (ZITHROMAX) tablet 1,000 mg (1,000 mg Oral Given 11/28/15 0953)    BP 146/99 mmHg  Pulse 116   Temp(Src) 97.6 F (36.4 C) (Tympanic)  Resp 16  Ht 5\' 7"  (1.702 m)  Wt 210 lb (95.255 kg)  BMI 32.88 kg/m2  SpO2 98% No data found.   Physical Exam  Constitutional: He is oriented to person, place, and time. He appears well-developed and well-nourished. No distress.  HENT:  Head: Normocephalic and atraumatic.  Cardiovascular: Normal rate, regular rhythm, normal heart sounds and intact distal pulses.   No murmur heard. Pulmonary/Chest: Effort normal and breath sounds normal. No respiratory distress. He has no wheezes. He has no rales.  Abdominal: Soft. Bowel sounds are normal. He exhibits no distension and no mass. There is no tenderness. There is no rebound and no guarding.  Neurological: He is alert and oriented to person, place, and time.  Skin: No rash noted. He is not diaphoretic.  Nursing note and vitals reviewed.   ED Course  Procedures (including critical care time)  Labs Review Labs Reviewed  URINALYSIS COMPLETEWITH MICROSCOPIC ([facility] ONLY) - Abnormal; Notable for the following:    Ketones, ur TRACE (*)    All other components within normal limits  CHLAMYDIA/NGC RT PCR ([facility] ONLY)  RPR  HIV ANTIBODY (ROUTINE TESTING)    Imaging Review No results found.   Visual Acuity Review  Right Eye Distance:   Left Eye Distance:   Bilateral Distance:    Right Eye Near:   Left Eye Near:    Bilateral Near:  MDM   1. Urinary frequency    1. Lab results and diagnosis reviewed with patient 2. Check urine GC/chlamydia 3. Patient given zithromax 1gm po x 1 as empiric treatment 4. Recommend supportive treatment with increased water intake 5. Follow-up prn if symptoms worsen or don't improve    Payton Mccallum, MD 11/28/15 1745

## 2015-11-28 NOTE — ED Notes (Addendum)
Patient c/o back pain off and on for a month.  Patient reports pressure when he has to go to the bathroom.  Patient c/o recent abdominal pain.  Patient denies fevers.  Patient reports some nausea.  Patient was seen on 11/26/15 by his PCP for these symptoms.  Blood work was done on 11/26/15 wnl.  PCP has put in a referral to GI.

## 2015-11-29 LAB — RPR: RPR Ser Ql: NONREACTIVE

## 2015-11-29 LAB — HIV ANTIBODY (ROUTINE TESTING W REFLEX): HIV Screen 4th Generation wRfx: NONREACTIVE

## 2015-12-17 ENCOUNTER — Other Ambulatory Visit: Payer: Self-pay

## 2015-12-18 ENCOUNTER — Ambulatory Visit (INDEPENDENT_AMBULATORY_CARE_PROVIDER_SITE_OTHER): Payer: BLUE CROSS/BLUE SHIELD | Admitting: Family Medicine

## 2015-12-18 ENCOUNTER — Encounter: Payer: Self-pay | Admitting: Family Medicine

## 2015-12-18 VITALS — BP 126/88 | HR 88 | Temp 97.9°F | Resp 16 | Wt 215.0 lb

## 2015-12-18 DIAGNOSIS — E7800 Pure hypercholesterolemia, unspecified: Secondary | ICD-10-CM

## 2015-12-18 DIAGNOSIS — I10 Essential (primary) hypertension: Secondary | ICD-10-CM

## 2015-12-18 NOTE — Progress Notes (Signed)
Patient ID: Austin Cane., male   DOB: Jul 07, 1971, 45 y.o.   MRN: [id number]         Patient: Austin Bradford. Male    DOB: 07/12/1971   45 y.o.   MRN: [id number] Visit Date: 12/18/2015  Today's Provider: Dortha Kern, PA   Chief Complaint  Patient presents with  . Hypertension  . Hyperlipidemia   Subjective:    HPI    Hypertension, follow-up:  BP Readings from Last 3 Encounters:  12/18/15 126/88  11/28/15 146/99  11/26/15 140/90    He was last seen for hypertension 3 months ago.  Management since that visit includes None .He reports excellent compliance with treatment. He is not having side effects.  He is exercising. He is adherent to low salt diet.   Outside blood pressures are: Pt reports he does not check his blood pressure at home. He is experiencing none.  Patient denies chest pain, dyspnea, fatigue, lower extremity edema and palpitations.   Cardiovascular risk factors include dyslipidemia.  ------------------------------------------------------------------------    Lipid/Cholesterol, Follow-up:   Last seen for this 3 months ago.  Management since that visit includes Started Simvastatin.  Last Lipid Panel:    Component Value Date/Time   CHOL 204* 09/13/2015 1016   CHOL 202* 09/14/2014   TRIG 238* 09/13/2015 1016   HDL 28* 09/13/2015 1016   HDL 34* 09/14/2014   CHOLHDL 7.3* 09/13/2015 1016   LDLCALC 128* 09/13/2015 1016   LDLCALC 136 09/14/2014    He reports excellent compliance with treatment. He is not having side effects.   Wt Readings from Last 3 Encounters:  12/18/15 215 lb (97.523 kg)  11/28/15 210 lb (95.255 kg)  11/26/15 216 lb 6.4 oz (98.158 kg)    ------------------------------------------------------------------------ Past Medical History  Diagnosis Date  . Hyperlipidemia   . Hypertension    Past Surgical History  Procedure Laterality Date  . No past surgeries     Family History  Problem Relation Age  of Onset  . Breast cancer Mother     history of  . Hyperlipidemia Father   . Hypertension Father   . Pancreatitis Father   . Heart disease Father   . Healthy Sister   . CVA Maternal Grandmother   . CAD Maternal Grandfather   . Ovarian cancer Paternal Grandmother    No Known Allergies Previous Medications   SIMVASTATIN (ZOCOR) 20 MG TABLET    TAKE 1 TABLET (20 MG TOTAL) BY MOUTH AT BEDTIME.   Review of Systems  Constitutional: Negative.   Respiratory: Negative.   Cardiovascular: Negative.   Gastrointestinal: Negative.   Neurological: Negative for dizziness, weakness, light-headedness, numbness and headaches.   Social History  Substance Use Topics  . Smoking status: Never Smoker   . Smokeless tobacco: Never Used  . Alcohol Use: 0.0 oz/week    0 Standard drinks or equivalent per week     Comment: occasionally   Objective:   BP 126/88 mmHg  Pulse 88  Temp(Src) 97.9 F (36.6 C) (Oral)  Resp 16  Wt 215 lb (97.523 kg) Body mass index is 33.67 kg/(m^2).   Physical Exam  Constitutional: He is oriented to person, place, and time. He appears well-developed and well-nourished. No distress.  HENT:  Head: Normocephalic and atraumatic.  Right Ear: Hearing normal.  Left Ear: Hearing normal.  Nose: Nose normal.  Eyes: Conjunctivae and lids are normal. Right eye exhibits no discharge. Left eye exhibits no discharge. No scleral icterus.  Neck: Neck supple.  Cardiovascular: Normal rate and regular rhythm.   Pulmonary/Chest: Effort normal and breath sounds normal. No respiratory distress.  Abdominal: Bowel sounds are normal.  Musculoskeletal: Normal range of motion.  Neurological: He is alert and oriented to person, place, and time.  Skin: Skin is intact. No lesion and no rash noted.  Psychiatric: He has a normal mood and affect. His speech is normal and behavior is normal. Thought content normal.      Assessment & Plan:     1. Essential (primary) hypertension Stable. Had some  elevation in the past but has come under control with diet and exercise changes. Will recheck labs and encouraged to lose some more weight with BMI over 33. Recheck pending reports - CBC with Differential/Platelet  2. Hypercholesterolemia without hypertriglyceridemia Tolerating Simvastatin without side effect. Working to [employer] diet better. Continue present dosage and recheck lab. - Comprehensive metabolic panel - Lipid panel - Hemoglobin A1c       Dortha Kernennis Chrismon, PA  [facility]

## 2015-12-20 LAB — CBC WITH DIFFERENTIAL/PLATELET
Basophils Absolute: 0 10*3/uL (ref 0.0–0.2)
Basos: 0 %
EOS (ABSOLUTE): 0.1 10*3/uL (ref 0.0–0.4)
Eos: 2 %
Hematocrit: 47.9 % (ref 37.5–51.0)
Hemoglobin: 16.8 g/dL (ref 12.6–17.7)
Immature Grans (Abs): 0 10*3/uL (ref 0.0–0.1)
Immature Granulocytes: 0 %
LYMPHS ABS: 1.9 10*3/uL (ref 0.7–3.1)
Lymphs: 35 %
MCH: 29.1 pg (ref 26.6–33.0)
MCHC: 35.1 g/dL (ref 31.5–35.7)
MCV: 83 fL (ref 79–97)
MONOCYTES: 10 %
Monocytes Absolute: 0.6 10*3/uL (ref 0.1–0.9)
NEUTROS ABS: 2.8 10*3/uL (ref 1.4–7.0)
Neutrophils: 53 %
PLATELETS: 273 10*3/uL (ref 150–379)
RBC: 5.78 x10E6/uL (ref 4.14–5.80)
RDW: 12.4 % (ref 12.3–15.4)
WBC: 5.4 10*3/uL (ref 3.4–10.8)

## 2015-12-20 LAB — LIPID PANEL
CHOL/HDL RATIO: 5.8 ratio — AB (ref 0.0–5.0)
Cholesterol, Total: 157 mg/dL (ref 100–199)
HDL: 27 mg/dL — ABNORMAL LOW (ref >39)
LDL CALC: 102 mg/dL — AB (ref 0–99)
Triglycerides: 140 mg/dL (ref 0–149)
VLDL CHOLESTEROL CAL: 28 mg/dL (ref 5–40)

## 2015-12-20 LAB — HEMOGLOBIN A1C
ESTIMATED AVERAGE GLUCOSE: 120 mg/dL
HEMOGLOBIN A1C: 5.8 % — AB (ref 4.8–5.6)

## 2015-12-20 LAB — COMPREHENSIVE METABOLIC PANEL
ALBUMIN: 4.7 g/dL (ref 3.5–5.5)
ALT: 30 IU/L (ref 0–44)
AST: 23 IU/L (ref 0–40)
Albumin/Globulin Ratio: 1.6 (ref 1.2–2.2)
Alkaline Phosphatase: 61 IU/L (ref 39–117)
BUN/Creatinine Ratio: 8 — ABNORMAL LOW (ref 9–20)
BUN: 9 mg/dL (ref 6–24)
Bilirubin Total: 0.6 mg/dL (ref 0.0–1.2)
CO2: 25 mmol/L (ref 18–29)
CREATININE: 1.06 mg/dL (ref 0.76–1.27)
Calcium: 9.7 mg/dL (ref 8.7–10.2)
Chloride: 102 mmol/L (ref 96–106)
GFR calc non Af Amer: 84 mL/min/{1.73_m2} (ref >59)
GFR, EST AFRICAN AMERICAN: 97 mL/min/{1.73_m2} (ref >59)
GLOBULIN, TOTAL: 3 g/dL (ref 1.5–4.5)
GLUCOSE: 98 mg/dL (ref 65–99)
Potassium: 4.7 mmol/L (ref 3.5–5.2)
SODIUM: 144 mmol/L (ref 134–144)
TOTAL PROTEIN: 7.7 g/dL (ref 6.0–8.5)

## 2016-03-07 ENCOUNTER — Encounter: Payer: Self-pay | Admitting: Family Medicine

## 2016-03-09 ENCOUNTER — Other Ambulatory Visit: Payer: Self-pay | Admitting: Family Medicine

## 2016-04-11 ENCOUNTER — Other Ambulatory Visit: Payer: Self-pay | Admitting: Family Medicine

## 2016-04-11 MED ORDER — SIMVASTATIN 20 MG PO TABS: 20.0000 mg | ORAL_TABLET | Freq: Every day | ORAL | 3 refills | Status: DC

## 2016-05-06 ENCOUNTER — Other Ambulatory Visit: Payer: Self-pay

## 2016-05-06 NOTE — Telephone Encounter (Signed)
Advise patient his chart shows we refilled the Simvastatin on 04-11-16 for 90 days with 3 refills and it shows the pharmacy confirmed receipt. He is due for repeat labs.

## 2016-05-06 NOTE — Telephone Encounter (Signed)
[facility] from [facility] requesting a 90 days supply on Simvastatin 20 mg.

## 2016-05-07 NOTE — Telephone Encounter (Signed)
[facility] per tech they did receive RX on 04/11/2016. Pharmacy will add note to remind patient to call us for a follow up appointment.

## 2016-06-24 ENCOUNTER — Encounter: Payer: Self-pay | Admitting: Family Medicine

## 2016-06-24 ENCOUNTER — Ambulatory Visit (INDEPENDENT_AMBULATORY_CARE_PROVIDER_SITE_OTHER): Payer: BLUE CROSS/BLUE SHIELD | Admitting: Family Medicine

## 2016-06-24 VITALS — BP 144/98 | HR 84 | Temp 98.0°F | Resp 16 | Wt 219.6 lb

## 2016-06-24 DIAGNOSIS — I10 Essential (primary) hypertension: Secondary | ICD-10-CM | POA: Diagnosis not present

## 2016-06-24 DIAGNOSIS — E7800 Pure hypercholesterolemia, unspecified: Secondary | ICD-10-CM

## 2016-06-24 MED ORDER — LISINOPRIL 5 MG PO TABS: 5.0000 mg | ORAL_TABLET | Freq: Every day | ORAL | 3 refills | Status: DC

## 2016-06-24 NOTE — Progress Notes (Signed)
Patient: Austin RiseJames Gran[employer]elms Jr. Male    DOB: 06/24/1971   45 y.o.   MRN: [id number] Visit Date: 06/24/2016  Today's Provider: Dortha Kernennis Chrismon, PA   Chief Complaint  Patient presents with  . Hypertension   Subjective:    HPI  Hypertension, follow-up:  BP Readings from Last 3 Encounters:  06/24/16 (!) 144/98  12/18/15 126/88  11/28/15 (!) 146/99    He was last seen for hypertension 6 months ago.  BP at that visit was 126/88. Management changes since that visit include none. He is not having side effects.  He is exercising. He is adherent to low salt diet.   Outside blood pressures are being checked. 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 Provider advised patient to follow up with PCP. 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  He is experiencing headaches.  Patient denies chest pain, irregular heart beat and palpitations.   Cardiovascular risk factors include dyslipidemia and male gender.  Use of agents associated with hypertension: none.     Weight trend: stable Wt Readings from Last 3 Encounters:  06/24/16 219 lb 9.6 oz (99.6 kg)  12/18/15 215 lb (97.5 kg)  11/28/15 210 lb (95.3 kg)    Current diet: in general, a "healthy" diet    ------------------------------------------------------------------------ Past Medical History:  Diagnosis Date  . Hyperlipidemia   . Hypertension    Past Surgical History:  Procedure Laterality Date  . NO PAST SURGERIES     Family History  Problem Relation Age of Onset  . Breast cancer Mother     history of  . Hyperlipidemia Father   . Hypertension Father   . Pancreatitis Father   . Heart disease Father   . Healthy Sister   . CVA Maternal Grandmother   . CAD Maternal Grandfather   . Ovarian cancer Paternal Grandmother    No Known Allergies   Previous Medications   SIMVASTATIN (ZOCOR) 20 MG TABLET    Take 1 tablet (20 mg total) by mouth at bedtime.    Review of Systems    Constitutional: Negative.   Respiratory: Negative.   Cardiovascular: Negative.   Neurological: Positive for headaches.    Social History  Substance Use Topics  . Smoking status: Never Smoker  . Smokeless tobacco: Never Used  . Alcohol use 0.0 oz/week     Comment: occasionally   Objective:   BP (!) 144/98 (BP Location: Right Arm, Patient Position: Sitting, Cuff Size: Normal)   Pulse 84   Temp 98 F (36.7 C) (Oral)   Resp 16   Wt 219 lb 9.6 oz (99.6 kg)   BMI 34.39 kg/m   Physical Exam  Constitutional: He is oriented to person, place, and time. He appears well-developed and well-nourished. No distress.  HENT:  Head: Normocephalic and atraumatic.  Right Ear: Hearing normal.  Left Ear: Hearing normal.  Nose: Nose normal.  Eyes: Conjunctivae and lids are normal. Right eye exhibits no discharge. Left eye exhibits no discharge. No scleral icterus.  Neck: Neck supple.  Cardiovascular: Normal rate and regular rhythm.   Pulmonary/Chest: Effort normal. No respiratory distress.  Abdominal: Soft. Bowel sounds are normal.  Musculoskeletal: Normal range of motion.  Neurological: He is alert and oriented to person, place, and time.  Skin: Skin is intact. No lesion and no rash noted.  Psychiatric: He has a normal mood and affect. His speech is normal and behavior is normal. Thought content normal.  Assessment & Plan:     1. Essential (primary) hypertension Elevation again. Has gained 4 more lbs since last check in may 2017. 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 Will start Lisinopril and recheck labs. Reassess BP in 1 month. - CBC with Differential/Platelet - Comprehensive metabolic panel - Lipid panel - TSH - lisinopril (PRINIVIL,ZESTRIL) 5 MG tablet; Take 1 tablet (5 mg total) by mouth daily.  Dispense: 30 tablet; Refill: 3  2. Hypercholesterolemia without hypertriglyceridemia Trying to follow low fat diet and restarted exercise  program this week. Tolerating simvastatin 20 mg qd without side effects. Will recheck labs and follow up pending reports. - Comprehensive metabolic panel - Lipid panel - TSH

## 2016-06-25 LAB — COMPREHENSIVE METABOLIC PANEL
A/G RATIO: 1.7 (ref 1.2–2.2)
ALT: 33 IU/L (ref 0–44)
AST: 25 IU/L (ref 0–40)
Albumin: 4.6 g/dL (ref 3.5–5.5)
Alkaline Phosphatase: 57 IU/L (ref 39–117)
BILIRUBIN TOTAL: 0.7 mg/dL (ref 0.0–1.2)
BUN/Creatinine Ratio: 12 (ref 9–20)
BUN: 13 mg/dL (ref 6–24)
CALCIUM: 9.6 mg/dL (ref 8.7–10.2)
CHLORIDE: 102 mmol/L (ref 96–106)
CO2: 27 mmol/L (ref 18–29)
Creatinine, Ser: 1.08 mg/dL (ref 0.76–1.27)
GFR, EST AFRICAN AMERICAN: 95 mL/min/{1.73_m2} (ref >59)
GFR, EST NON AFRICAN AMERICAN: 82 mL/min/{1.73_m2} (ref >59)
GLOBULIN, TOTAL: 2.7 g/dL (ref 1.5–4.5)
Glucose: 108 mg/dL — ABNORMAL HIGH (ref 65–99)
POTASSIUM: 4.4 mmol/L (ref 3.5–5.2)
SODIUM: 143 mmol/L (ref 134–144)
TOTAL PROTEIN: 7.3 g/dL (ref 6.0–8.5)

## 2016-06-25 LAB — CBC WITH DIFFERENTIAL/PLATELET
BASOS: 0 %
Basophils Absolute: 0 10*3/uL (ref 0.0–0.2)
EOS (ABSOLUTE): 0.1 10*3/uL (ref 0.0–0.4)
EOS: 1 %
HEMATOCRIT: 48.7 % (ref 37.5–51.0)
Hemoglobin: 16.9 g/dL (ref 12.6–17.7)
IMMATURE GRANULOCYTES: 0 %
Immature Grans (Abs): 0 10*3/uL (ref 0.0–0.1)
Lymphocytes Absolute: 2 10*3/uL (ref 0.7–3.1)
Lymphs: 31 %
MCH: 28.5 pg (ref 26.6–33.0)
MCHC: 34.7 g/dL (ref 31.5–35.7)
MCV: 82 fL (ref 79–97)
MONOS ABS: 0.6 10*3/uL (ref 0.1–0.9)
Monocytes: 10 %
NEUTROS ABS: 3.7 10*3/uL (ref 1.4–7.0)
Neutrophils: 58 %
PLATELETS: 294 10*3/uL (ref 150–379)
RBC: 5.94 x10E6/uL — ABNORMAL HIGH (ref 4.14–5.80)
RDW: 12.4 % (ref 12.3–15.4)
WBC: 6.4 10*3/uL (ref 3.4–10.8)

## 2016-06-25 LAB — LIPID PANEL
CHOL/HDL RATIO: 5.2 ratio — AB (ref 0.0–5.0)
Cholesterol, Total: 166 mg/dL (ref 100–199)
HDL: 32 mg/dL — ABNORMAL LOW (ref >39)
LDL Calculated: 105 mg/dL — ABNORMAL HIGH (ref 0–99)
Triglycerides: 147 mg/dL (ref 0–149)
VLDL Cholesterol Cal: 29 mg/dL (ref 5–40)

## 2016-06-25 LAB — TSH: TSH: 2.72 u[IU]/mL (ref 0.450–4.500)

## 2016-06-27 ENCOUNTER — Telehealth: Payer: Self-pay

## 2016-06-27 NOTE — Telephone Encounter (Signed)
[id number] 

## 2016-06-27 NOTE — Telephone Encounter (Signed)
-----   Message from Tamsen Roersennis E Chrismon, [place]PA sent at 06/27/2016  8:40 AM EST ----- HDL cholesterol slowly improving. Continue cholesterol medication, diet and exercise. Recheck levels in 6 months.

## 2016-07-01 NOTE — Telephone Encounter (Signed)
[id number] 

## 2016-07-01 NOTE — Telephone Encounter (Signed)
Patient advised. Patient will call back to schedule a follow up appointment.

## 2016-07-24 ENCOUNTER — Ambulatory Visit (INDEPENDENT_AMBULATORY_CARE_PROVIDER_SITE_OTHER): Payer: BLUE CROSS/BLUE SHIELD | Admitting: Family Medicine

## 2016-07-24 ENCOUNTER — Encounter: Payer: Self-pay | Admitting: Family Medicine

## 2016-07-24 VITALS — BP 128/84 | HR 80 | Temp 98.9°F | Resp 16 | Wt 217.0 lb

## 2016-07-24 DIAGNOSIS — I10 Essential (primary) hypertension: Secondary | ICD-10-CM | POA: Diagnosis not present

## 2016-07-24 NOTE — Progress Notes (Signed)
Patient: Austin RiseJames [employer] Jr[employer] Male    DOB: 10/05/1970   45 y.o.   MRN: [id number] Visit Date: 07/24/2016  Today's Provider: Dortha Kernennis Kaleigh Spiegelman, PA   Follow up hypertension.  Subjective:    HPI  Hypertension, follow-up:  BP Readings from Last 3 Encounters:  07/24/16 128/84  06/24/16 (!) 144/98  12/18/15 126/88    He was last seen for hypertension 1 months ago.  BP at that visit was 144/98. Management since that visit includes adding lisinopril. He reports good compliance with treatment. He is not having side effects.  He is exercising. He is adherent to low salt diet.   Outside blood pressures are not being checked. Patient denies exertional chest pressure/discomfort, lower extremity edema, palpitations and syncope.   Cardiovascular risk factors include dyslipidemia.     Weight trend: stable Wt Readings from Last 3 Encounters:  07/24/16 217 lb (98.4 kg)  06/24/16 219 lb 9.6 oz (99.6 kg)  12/18/15 215 lb (97.5 kg)    Current diet: well balanced   Past Medical History:  Diagnosis Date  . Hyperlipidemia   . Hypertension    Past Surgical History:  Procedure Laterality Date  . NO PAST SURGERIES     Family History  Problem Relation Age of Onset  . Breast cancer Mother     history of  . Hyperlipidemia Father   . Hypertension Father   . Pancreatitis Father   . Heart disease Father   . Healthy Sister   . CVA Maternal Grandmother   . CAD Maternal Grandfather   . Ovarian cancer Paternal Grandmother      No Known Allergies  Current Outpatient Prescriptions:  .  lisinopril (PRINIVIL,ZESTRIL) 5 MG tablet, Take 1 tablet (5 mg total) by mouth daily., Disp: 30 tablet, Rfl: 3 .  simvastatin (ZOCOR) 20 MG tablet, Take 1 tablet (20 mg total) by mouth at bedtime., Disp: 90 tablet, Rfl: 3  Review of Systems  Constitutional: Negative.   Respiratory: Negative.   Cardiovascular: Negative.   Neurological: Negative.     Social History  Substance Use Topics   . Smoking status: Never Smoker  . Smokeless tobacco: Never Used  . Alcohol use 0.0 oz/week     Comment: occasionally   Objective:   BP 128/84 (BP Location: Right Arm, Patient Position: Sitting, Cuff Size: Large)   Pulse 80   Temp 98.9 F (37.2 C)   Resp 16   Wt 217 lb (98.4 kg)   BMI 33.99 kg/m   Physical Exam  Constitutional: He is oriented to person, place, and time. He appears well-developed and well-nourished. No distress.  HENT:  Head: Normocephalic and atraumatic.  Right Ear: Hearing normal.  Left Ear: Hearing normal.  Nose: Nose normal.  Eyes: Conjunctivae and lids are normal. Right eye exhibits no discharge. Left eye exhibits no discharge. No scleral icterus.  Neck: Neck supple.  Cardiovascular: Normal rate and regular rhythm.   Pulmonary/Chest: Effort normal and breath sounds normal. No respiratory distress.  Abdominal: Soft. Bowel sounds are normal.  Musculoskeletal: Normal range of motion.  Neurological: He is alert and oriented to person, place, and time.  Skin: Skin is intact. No lesion and no rash noted.  Psychiatric: He has a normal mood and affect. His speech is normal and behavior is normal. Thought content normal.      Assessment & Plan:     1. Essential (primary) hypertension Well controlled and tolerating Lisinopril 5 mg qd without side effects. No  dizziness or hypotensive episodes. Recheck in 4-5 months or when scheduled for CPE in January.       Dortha Kernennis Ezekeil Bethel, PA  [facility]

## 2016-10-20 ENCOUNTER — Telehealth: Payer: Self-pay

## 2016-10-20 ENCOUNTER — Other Ambulatory Visit: Payer: Self-pay | Admitting: Family Medicine

## 2016-10-20 DIAGNOSIS — I10 Essential (primary) hypertension: Secondary | ICD-10-CM

## 2016-10-20 MED ORDER — LISINOPRIL 5 MG PO TABS: 5.0000 mg | ORAL_TABLET | Freq: Every day | ORAL | 3 refills | Status: DC

## 2016-10-20 NOTE — Telephone Encounter (Signed)
[facility] lisinopril (PRINIVIL,ZESTRIL) 5 MG tablet

## 2016-10-20 NOTE — Telephone Encounter (Signed)
Done

## 2016-11-12 ENCOUNTER — Other Ambulatory Visit: Payer: Self-pay | Admitting: Family Medicine

## 2016-11-12 DIAGNOSIS — I10 Essential (primary) hypertension: Secondary | ICD-10-CM

## 2016-11-12 NOTE — Telephone Encounter (Signed)
Pt needs refill coming up.  He has changed  pharmacy per insurance  He now uses [facility] now.  Their fax number is [phone].  lisinopril (PRINIVIL,ZESTRIL) 5 MG tablet  simvastatin (ZOCOR) 20 MG tablet  Thanks teri

## 2016-11-13 MED ORDER — LISINOPRIL 5 MG PO TABS: 5.0000 mg | ORAL_TABLET | Freq: Every day | ORAL | 3 refills | Status: DC

## 2016-11-13 MED ORDER — SIMVASTATIN 20 MG PO TABS: 20.0000 mg | ORAL_TABLET | Freq: Every day | ORAL | 3 refills | Status: DC

## 2016-12-02 ENCOUNTER — Encounter: Payer: Self-pay | Admitting: Family Medicine

## 2016-12-02 ENCOUNTER — Ambulatory Visit (INDEPENDENT_AMBULATORY_CARE_PROVIDER_SITE_OTHER): Payer: BLUE CROSS/BLUE SHIELD | Admitting: Family Medicine

## 2016-12-02 VITALS — BP 128/90 | HR 87 | Temp 98.2°F | Ht 67.5 in | Wt 215.8 lb

## 2016-12-02 DIAGNOSIS — Z0000 Encounter for general adult medical examination without abnormal findings: Secondary | ICD-10-CM | POA: Diagnosis not present

## 2016-12-02 DIAGNOSIS — E7800 Pure hypercholesterolemia, unspecified: Secondary | ICD-10-CM

## 2016-12-02 DIAGNOSIS — I10 Essential (primary) hypertension: Secondary | ICD-10-CM

## 2016-12-02 NOTE — Progress Notes (Signed)
Patient: Austin Bradford., Male    DOB: 1970-12-11, 46 y.o.   MRN: [id number] Visit Date: 12/02/2016  Today's Provider: Dortha Kern, PA   Chief Complaint  Patient presents with  . Annual Exam   Subjective:    Annual physical exam Austin Bradford. is a 46 y.o. male who presents today for health maintenance and complete physical. He feels well. He reports exercising 4 times per week. He reports he is sleeping well.  -----------------------------------------------------------------   Review of Systems  Constitutional: Negative.   HENT: Positive for congestion.   Eyes: Negative.   Respiratory: Negative.   Cardiovascular: Negative.   Gastrointestinal: Negative.   Endocrine: Negative.   Genitourinary: Negative.   Musculoskeletal: Negative.   Allergic/Immunologic: Negative.   Neurological: Negative.   Hematological: Negative.   Psychiatric/Behavioral: Negative.     Social History      He  reports that he has never smoked. He has never used smokeless tobacco. He reports that he drinks alcohol. He reports that he does not use drugs.       Social History   Social History  . Marital status: Married    Spouse name: N/A  . Number of children: N/A  . Years of education: N/A   Social History Main Topics  . Smoking status: Never Smoker  . Smokeless tobacco: Never Used  . Alcohol use 0.0 oz/week     Comment: occasionally  . Drug use: No  . Sexual activity: Not Asked   Other Topics Concern  . None   Social History Narrative  . None    Past Medical History:  Diagnosis Date  . Hyperlipidemia   . Hypertension      Patient Active Problem List   Diagnosis Date Noted  . H/O renal calculi 09/07/2015  . Hypercholesterolemia without hypertriglyceridemia 07/26/2009  . Essential (primary) hypertension 09/24/2007    Past Surgical History:  Procedure Laterality Date  . NO PAST SURGERIES      Family History        Family Status  Relation  Status  . Mother Alive  . Father Deceased  . Sister Alive  . Maternal Grandmother Alive  . Maternal Grandfather Alive  . Paternal Grandmother Deceased        His family history includes Breast cancer in his mother; CAD in his maternal grandfather; CVA in his maternal grandmother; Healthy in his sister; Heart disease in his father; Hyperlipidemia in his father; Hypertension in his father; Ovarian cancer in his paternal grandmother; Pancreatitis in his father.     No Known Allergies  Current Outpatient Prescriptions:  .  lisinopril (PRINIVIL,ZESTRIL) 5 MG tablet, Take 1 tablet (5 mg total) by mouth daily., Disp: 90 tablet, Rfl: 3 .  simvastatin (ZOCOR) 20 MG tablet, Take 1 tablet (20 mg total) by mouth at bedtime., Disp: 90 tablet, Rfl: 3   Patient Care Team: Tamsen Roers, PA as PCP - General (Family Medicine)      Objective:   Vitals: BP 128/90 (BP Location: Right Arm, Patient Position: Sitting, Cuff Size: Normal)   Pulse 87   Temp 98.2 F (36.8 C) (Oral)   Ht 5' 7.5" (1.715 m)   Wt 215 lb 12.8 oz (97.9 kg)   SpO2 97%   BMI 33.30 kg/m    Wt Readings from Last 3 Encounters:  12/02/16 215 lb 12.8 oz (97.9 kg)  07/24/16 217 lb (98.4 kg)  06/24/16 219 lb 9.6 oz (99.6 kg)  Vitals:   12/02/16 0853  BP: 128/90  Pulse: 87  Temp: 98.2 F (36.8 C)  TempSrc: Oral  SpO2: 97%  Weight: 215 lb 12.8 oz (97.9 kg)  Height: 5' 7.5" (1.715 m)     Physical Exam  Constitutional: He is oriented to person, place, and time. He appears well-developed and well-nourished.  HENT:  Head: Normocephalic and atraumatic.  Right Ear: External ear normal.  Left Ear: External ear normal.  Nose: Nose normal.  Mouth/Throat: Oropharynx is clear and moist.  Eyes: Conjunctivae and EOM are normal. Pupils are equal, round, and reactive to light. Right eye exhibits no discharge.  Neck: Normal range of motion. Neck supple. No tracheal deviation present. No thyromegaly present.  Cardiovascular:  Normal rate, regular rhythm, normal heart sounds and intact distal pulses.   No murmur heard. Pulmonary/Chest: Effort normal and breath sounds normal. No respiratory distress. He has no wheezes. He has no rales. He exhibits no tenderness.  Abdominal: Soft. Bowel sounds are normal. He exhibits no distension and no mass. There is no tenderness. There is no rebound and no guarding.  Genitourinary: Rectum normal, prostate normal and penis normal.  Musculoskeletal: Normal range of motion. He exhibits no edema or tenderness.  Lymphadenopathy:    He has no cervical adenopathy.  Neurological: He is alert and oriented to person, place, and time. He has normal reflexes. No cranial nerve deficit. He exhibits normal muscle tone. Coordination normal.  Skin: Skin is warm and dry. No rash noted. No erythema.  Psychiatric: He has a normal mood and affect. His behavior is normal. Judgment and thought content normal.    Depression Screen PHQ 2/9 Scores 12/02/2016 09/13/2015  PHQ - 2 Score 0 0  PHQ- 9 Score 0 -    Assessment & Plan:     Routine Health Maintenance and Physical Exam  Exercise Activities and Dietary recommendations Goals    Continue exercise 4 days a week and low fat diet. Drink 6 glasses of water a day.      Immunization History  Administered Date(s) Administered  . Tdap 10/14/2005, 09/13/2015    Health Maintenance  Topic Date Due  . INFLUENZA VACCINE  03/04/2017  . TETANUS/TDAP  09/12/2025  . HIV Screening  Completed     Discussed health benefits of physical activity, and encouraged him to engage in regular exercise appropriate for his age and condition.    -------------------------------------------------------------------- 1. Annual physical exam Good general health. Has lost 4 lbs in the past 6 months with routine exercise and watching diet better. Immunizations up to date. Given anticipatory counseling and recheck routine labs to complete his Biometric Screening Form for  [employer] at work. Recheck pending lab reports. - CBC with Differential/Platelet - Comprehensive metabolic panel - Lipid panel - Hemoglobin A1c  2. Hypercholesterolemia without hypertriglyceridemia Tolerating Simvastatin 20 mg qd and following low fat diet. Exercising 4 day a week and feeling well. Recheck labs and continue same dosage. - Comprehensive metabolic panel - Lipid panel - TSH  3. Essential (primary) hypertension Stable and tolerating Lisinopril 5 mg qd without side effects. No palpitations, dyspnea, swelling or cough. Recheck routine labs and continue present dosage. - CBC with Differential/Platelet - Comprehensive metabolic panel - Lipid panel - TSH    Dortha Kern, PA  [facility]

## 2016-12-02 NOTE — Patient Instructions (Signed)
 Health Maintenance, Male A healthy lifestyle and preventive care is important for your health and wellness. Ask your health care provider about what schedule of regular examinations is right for you. What should I know about weight and diet?  Eat a Healthy Diet  Eat plenty of vegetables, fruits, whole grains, low-fat dairy products, and lean protein.  Do not eat a lot of foods high in solid fats, added sugars, or salt. Maintain a Healthy Weight  Regular exercise can help you achieve or maintain a healthy weight. You should:  Do at least 150 minutes of exercise each week. The exercise should increase your heart rate and make you sweat (moderate-intensity exercise).  Do strength-training exercises at least twice a week. Watch Your Levels of Cholesterol and Blood Lipids  Have your blood tested for lipids and cholesterol every 5 years starting at 46 years of age. If you are at high risk for heart disease, you should start having your blood tested when you are 46 years old. You may need to have your cholesterol levels checked more often if:  Your lipid or cholesterol levels are high.  You are older than 46 years of age.  You are at high risk for heart disease. What should I know about cancer screening? Many types of cancers can be detected early and may often be prevented. Lung Cancer  You should be screened every year for lung cancer if:  You are a current smoker who has smoked for at least 30 years.  You are a former smoker who has quit within the past 15 years.  Talk to your health care provider about your screening options, when you should start screening, and how often you should be screened. Colorectal Cancer  Routine colorectal cancer screening usually begins at 46 years of age and should be repeated every 5-10 years until you are 46 years old. You may need to be screened more often if early forms of precancerous polyps or small growths are found. Your health care provider  may recommend screening at an earlier age if you have risk factors for colon cancer.  Your health care provider may recommend using home test kits to check for hidden blood in the stool.  A small camera at the end of a tube can be used to examine your colon (sigmoidoscopy or colonoscopy). This checks for the earliest forms of colorectal cancer. Prostate and Testicular Cancer  Depending on your age and overall health, your health care provider may do certain tests to screen for prostate and testicular cancer.  Talk to your health care provider about any symptoms or concerns you have about testicular or prostate cancer. Skin Cancer  Check your skin from head to toe regularly.  Tell your health care provider about any new moles or changes in moles, especially if:  There is a change in a mole's size, shape, or color.  You have a mole that is larger than a pencil eraser.  Always use sunscreen. Apply sunscreen liberally and repeat throughout the day.  Protect yourself by wearing long sleeves, pants, a wide-brimmed hat, and sunglasses when outside. What should I know about heart disease, diabetes, and high blood pressure?  If you are 18-39 years of age, have your blood pressure checked every 3-5 years. If you are 40 years of age or older, have your blood pressure checked every year. You should have your blood pressure measured twice-once when you are at a hospital or clinic, and once when you are not at   a hospital or clinic. Record the average of the two measurements. To check your blood pressure when you are not at a hospital or clinic, you can use:  An automated blood pressure machine at a pharmacy.  A home blood pressure monitor.  Talk to your health care provider about your target blood pressure.  If you are between 45-79 years old, ask your health care provider if you should take aspirin to prevent heart disease.  Have regular diabetes screenings by checking your fasting blood sugar  level.  If you are at a normal weight and have a low risk for diabetes, have this test once every three years after the age of 45.  If you are overweight and have a high risk for diabetes, consider being tested at a younger age or more often.  A one-time screening for abdominal aortic aneurysm (AAA) by ultrasound is recommended for men aged 65-75 years who are current or former smokers. What should I know about preventing infection? Hepatitis B  If you have a higher risk for hepatitis B, you should be screened for this virus. Talk with your health care provider to find out if you are at risk for hepatitis B infection. Hepatitis C  Blood testing is recommended for:  Everyone born from 1945 through 1965.  Anyone with known risk factors for hepatitis C. Sexually Transmitted Diseases (STDs)  You should be screened each year for STDs including gonorrhea and chlamydia if:  You are sexually active and are younger than 46 years of age.  You are older than 46 years of age and your health care provider tells you that you are at risk for this type of infection.  Your sexual activity has changed since you were last screened and you are at an increased risk for chlamydia or gonorrhea. Ask your health care provider if you are at risk.  Talk with your health care provider about whether you are at high risk of being infected with HIV. Your health care provider may recommend a prescription medicine to help prevent HIV infection. What else can I do?  Schedule regular health, dental, and eye exams.  Stay current with your vaccines (immunizations).  Do not use any tobacco products, such as cigarettes, chewing tobacco, and e-cigarettes. If you need help quitting, ask your health care provider.  Limit alcohol intake to no more than 2 drinks per day. One drink equals 12 ounces of beer, 5 ounces of wine, or 1 ounces of hard liquor.  Do not use street drugs.  Do not share needles.  Ask your health  care provider for help if you need support or information about quitting drugs.  Tell your health care provider if you often feel depressed.  Tell your health care provider if you have ever been abused or do not feel safe at home. This information is not intended to replace advice given to you by your health care provider. Make sure you discuss any questions you have with your health care provider. Document Released: 01/17/2008 Document Revised: 03/19/2016 Document Reviewed: 04/24/2015 Elsevier Interactive Patient Education  2017 Elsevier Inc.  

## 2016-12-03 LAB — COMPREHENSIVE METABOLIC PANEL
ALBUMIN: 4.4 g/dL (ref 3.5–5.5)
ALT: 33 IU/L (ref 0–44)
AST: 28 IU/L (ref 0–40)
Albumin/Globulin Ratio: 1.7 (ref 1.2–2.2)
Alkaline Phosphatase: 62 IU/L (ref 39–117)
BUN / CREAT RATIO: 9 (ref 9–20)
BUN: 9 mg/dL (ref 6–24)
Bilirubin Total: 0.3 mg/dL (ref 0.0–1.2)
CALCIUM: 9.1 mg/dL (ref 8.7–10.2)
CO2: 26 mmol/L (ref 18–29)
CREATININE: 0.97 mg/dL (ref 0.76–1.27)
Chloride: 101 mmol/L (ref 96–106)
GFR calc Af Amer: 108 mL/min/{1.73_m2} (ref >59)
GFR, EST NON AFRICAN AMERICAN: 93 mL/min/{1.73_m2} (ref >59)
GLOBULIN, TOTAL: 2.6 g/dL (ref 1.5–4.5)
GLUCOSE: 109 mg/dL — AB (ref 65–99)
Potassium: 4.4 mmol/L (ref 3.5–5.2)
SODIUM: 140 mmol/L (ref 134–144)
TOTAL PROTEIN: 7 g/dL (ref 6.0–8.5)

## 2016-12-03 LAB — LIPID PANEL
CHOL/HDL RATIO: 5.6 ratio — AB (ref 0.0–5.0)
CHOLESTEROL TOTAL: 141 mg/dL (ref 100–199)
HDL: 25 mg/dL — ABNORMAL LOW (ref >39)
LDL CALC: 90 mg/dL (ref 0–99)
TRIGLYCERIDES: 128 mg/dL (ref 0–149)
VLDL CHOLESTEROL CAL: 26 mg/dL (ref 5–40)

## 2016-12-03 LAB — TSH: TSH: 2.69 u[IU]/mL (ref 0.450–4.500)

## 2016-12-03 LAB — CBC WITH DIFFERENTIAL/PLATELET
BASOS: 0 %
Basophils Absolute: 0 10*3/uL (ref 0.0–0.2)
EOS (ABSOLUTE): 0.1 10*3/uL (ref 0.0–0.4)
EOS: 2 %
HEMATOCRIT: 45.9 % (ref 37.5–51.0)
Hemoglobin: 15.7 g/dL (ref 13.0–17.7)
Immature Grans (Abs): 0 10*3/uL (ref 0.0–0.1)
Immature Granulocytes: 0 %
LYMPHS ABS: 1.4 10*3/uL (ref 0.7–3.1)
Lymphs: 28 %
MCH: 28.1 pg (ref 26.6–33.0)
MCHC: 34.2 g/dL (ref 31.5–35.7)
MCV: 82 fL (ref 79–97)
MONOS ABS: 0.7 10*3/uL (ref 0.1–0.9)
Monocytes: 13 %
Neutrophils Absolute: 2.8 10*3/uL (ref 1.4–7.0)
Neutrophils: 57 %
Platelets: 271 10*3/uL (ref 150–379)
RBC: 5.58 x10E6/uL (ref 4.14–5.80)
RDW: 12.4 % (ref 12.3–15.4)
WBC: 5 10*3/uL (ref 3.4–10.8)

## 2016-12-03 LAB — HEMOGLOBIN A1C
ESTIMATED AVERAGE GLUCOSE: 117 mg/dL
HEMOGLOBIN A1C: 5.7 % — AB (ref 4.8–5.6)

## 2016-12-04 ENCOUNTER — Telehealth: Payer: Self-pay

## 2016-12-04 NOTE — Telephone Encounter (Signed)
Patient advised.

## 2016-12-04 NOTE — Telephone Encounter (Signed)
-----   Message from Tamsen Roersennis E Chrismon, [place] sent at 12/04/2016 11:12 AM EDT ----- All tests essentially normal except HDL cholesterol very low. Recommend adding Krill Oil (MegaRed) qd to the Simvastatin. Hgb A1C is slightly better and only 1/10th of a point from being in the normal range. Recheck levels in 6 months.

## 2016-12-16 DIAGNOSIS — Z85820 Personal history of malignant melanoma of skin: Secondary | ICD-10-CM | POA: Diagnosis not present

## 2016-12-16 DIAGNOSIS — Z872 Personal history of diseases of the skin and subcutaneous tissue: Secondary | ICD-10-CM | POA: Diagnosis not present

## 2016-12-16 DIAGNOSIS — Z86018 Personal history of other benign neoplasm: Secondary | ICD-10-CM | POA: Diagnosis not present

## 2017-11-16 ENCOUNTER — Ambulatory Visit: Payer: BLUE CROSS/BLUE SHIELD | Admitting: Family Medicine

## 2017-11-16 ENCOUNTER — Encounter: Payer: Self-pay | Admitting: Family Medicine

## 2017-11-16 VITALS — BP 156/90 | HR 94 | Temp 97.6°F | Resp 16 | Wt 200.8 lb

## 2017-11-16 DIAGNOSIS — R1032 Left lower quadrant pain: Secondary | ICD-10-CM

## 2017-11-16 DIAGNOSIS — K5790 Diverticulosis of intestine, part unspecified, without perforation or abscess without bleeding: Secondary | ICD-10-CM | POA: Diagnosis not present

## 2017-11-16 LAB — POCT URINALYSIS DIPSTICK
Bilirubin, UA: NEGATIVE
Glucose, UA: NEGATIVE
Ketones, UA: NEGATIVE
LEUKOCYTES UA: NEGATIVE
NITRITE UA: NEGATIVE
PH UA: 6.5 (ref 5.0–8.0)
PROTEIN UA: NEGATIVE
Spec Grav, UA: <=1.005 — AB (ref 1.010–1.025)
UROBILINOGEN UA: 0.2 U/dL

## 2017-11-16 MED ORDER — CIPROFLOXACIN HCL 500 MG PO TABS: 500.0000 mg | ORAL_TABLET | Freq: Two times a day (BID) | ORAL | 0 refills | Status: DC

## 2017-11-16 MED ORDER — METRONIDAZOLE 500 MG PO TABS: 500.0000 mg | ORAL_TABLET | Freq: Three times a day (TID) | ORAL | 0 refills | Status: DC

## 2017-11-16 NOTE — Patient Instructions (Signed)
Discussed putting bowel to rest for 24-48 hours with clear liquids and soups. Diverticulitis Diverticulitis is when small pockets in your large intestine (colon) get infected or swollen. This causes stomach pain and watery poop (diarrhea). These pouches are called diverticula. They form in people who have a condition called diverticulosis. Follow these instructions at home: Medicines  Take over-the-counter and prescription medicines only as told by your doctor. These include: ? Antibiotics. ? Pain medicines. ? Fiber pills. ? Probiotics. ? Stool softeners.  Do not drive or use heavy machinery while taking prescription pain medicine.  If you were prescribed an antibiotic, take it as told. Do not stop taking it even if you feel better. General instructions  Follow a diet as told by your doctor.  When you feel better, your doctor may tell you to change your diet. You may need to eat a lot of fiber. Fiber makes it easier to poop (have bowel movements). Healthy foods with fiber include: ? Berries. ? Beans. ? Lentils. ? Green vegetables.  Exercise 3 or more times a week. Aim for 30 minutes each time. Exercise enough to sweat and make your heart beat faster.  Keep all follow-up visits as told. This is important. You may need to have an exam of the large intestine. This is called a colonoscopy. Contact a doctor if:  Your pain does not get better.  You have a hard time eating or drinking.  You are not pooping like normal. Get help right away if:  Your pain gets worse.  Your problems do not get better.  Your problems get worse very fast.  You have a fever.  You throw up (vomit) more than one time.  You have poop that is: ? Bloody. ? Black. ? Tarry. Summary  Diverticulitis is when small pockets in your large intestine (colon) get infected or swollen.  Take medicines only as told by your doctor.  Follow a diet as told by your doctor. This information is not intended to  replace advice given to you by your health care provider. Make sure you discuss any questions you have with your health care provider. Document Released: 01/07/2008 Document Revised: 08/07/2016 Document Reviewed: 08/07/2016 Elsevier Interactive Patient Education  2017 [employer]

## 2017-11-16 NOTE — Progress Notes (Signed)
Subjective:     Patient ID: Austin [name], Austin Bradford   DOB: 04/15/1971, 47 y.o.   MRN: [id number] Chief Complaint  Patient presents with  . Abdominal Pain    Patient comes in office today with complaints of lower abdominal pain that started 6 days ago, patient describes pain as a pressure feeling and states that he has had diarrhea and bloating. Patient states that he has been taking otc Ibuprofen for relief.    HPI States usual bowel pattern is 2-3 x day formed to "stringy". Reports colonoscopy in 2017 c/w diverticulosis after prior episode of lower abdominal pain treated as diverticulitis. Past history of kidney stones.  Review of Systems     Objective:   Physical Exam  Constitutional: He appears well-developed and well-nourished. No distress.  Abdominal: Soft. There is no tenderness (Patient localizes to LLQ). There is no guarding.       Assessment:    1. Left lower quadrant pain: will cover for diverticulitis - POCT urinalysis dipstick - ciprofloxacin (CIPRO) 500 MG tablet; Take 1 tablet (500 mg total) by mouth 2 (two) times daily.  Dispense: 14 tablet; Refill: 0 - metroNIDAZOLE (FLAGYL) 500 MG tablet; Take 1 tablet (500 mg total) by mouth 3 (three) times daily.  Dispense: 21 tablet; Refill: 0  2. Diverticulosis    Plan:    Handout provided. Discussed bowel rest for 24-48 hours.

## 2017-11-23 ENCOUNTER — Encounter: Payer: Self-pay | Admitting: Family Medicine

## 2017-11-23 ENCOUNTER — Ambulatory Visit: Payer: BLUE CROSS/BLUE SHIELD | Admitting: Family Medicine

## 2017-11-23 VITALS — BP 136/78 | HR 54 | Temp 98.4°F | Resp 16 | Wt 195.0 lb

## 2017-11-23 DIAGNOSIS — R350 Frequency of micturition: Secondary | ICD-10-CM

## 2017-11-23 DIAGNOSIS — R1032 Left lower quadrant pain: Secondary | ICD-10-CM

## 2017-11-23 LAB — POCT URINALYSIS DIPSTICK
BILIRUBIN UA: NEGATIVE
Glucose, UA: NEGATIVE
Ketones, UA: NEGATIVE
Leukocytes, UA: NEGATIVE
Nitrite, UA: NEGATIVE
Protein, UA: NEGATIVE
Spec Grav, UA: 1.01 (ref 1.010–1.025)
Urobilinogen, UA: 0.2 E.U./dL
pH, UA: 7 (ref 5.0–8.0)

## 2017-11-23 NOTE — Progress Notes (Signed)
Subjective:     Patient ID: Austin [name], male   DOB: 11/08/1970, 47 y.o.   MRN: [id number] Chief Complaint  Patient presents with  . Follow-up    pt is here for follow up of LLQ pain/diverticulosis. He reports that he is is feeling a little better, but still does not feel right. He did start having cold symptoms along with this so it is hard to tell. He has no appetite, watery diarrhea. he has finished the antibiotic.    HPI States cold sx are improving. Reports watery loose stools twice daily, increased urination (no nocturia) in the last 24 hours, and decreased appetite. He completed the last abx this AM. Reports transient fever of 100 degrees last night.  Review of Systems     Objective:   Physical Exam  Constitutional: He appears well-developed and well-nourished. No distress.  Abdominal: Soft. There is no tenderness. There is no guarding.       Assessment:    1. Left lower quadrant pain: improved-Current sx may be ascribable to S.E.'s from medication.  2. Urinary frequency - POCT urinalysis dipstick - Urine Culture    Plan:   Further f/u pending urine culture results.

## 2017-11-23 NOTE — Patient Instructions (Addendum)
Let me know if not feeling better over the next day or two when off the antibiotics. We will cal you with the urine culture results.

## 2017-11-25 LAB — URINE CULTURE: Organism ID, Bacteria: NO GROWTH

## 2017-12-01 ENCOUNTER — Encounter: Payer: Self-pay | Admitting: Family Medicine

## 2017-12-01 ENCOUNTER — Ambulatory Visit: Payer: BLUE CROSS/BLUE SHIELD | Admitting: Family Medicine

## 2017-12-01 VITALS — BP 118/84 | HR 88 | Temp 98.0°F | Resp 14 | Ht 67.0 in | Wt 194.8 lb

## 2017-12-01 DIAGNOSIS — R300 Dysuria: Secondary | ICD-10-CM | POA: Diagnosis not present

## 2017-12-01 LAB — POCT URINALYSIS DIPSTICK
Bilirubin, UA: NEGATIVE
GLUCOSE UA: NEGATIVE
KETONES UA: NEGATIVE
Leukocytes, UA: NEGATIVE
Nitrite, UA: NEGATIVE
Odor: NORMAL
Protein, UA: NEGATIVE
RBC UA: NEGATIVE
SPEC GRAV UA: 1.015 (ref 1.010–1.025)
Urobilinogen, UA: 0.2 E.U./dL
pH, UA: 5 (ref 5.0–8.0)

## 2017-12-01 MED ORDER — DOXYCYCLINE HYCLATE 100 MG PO TABS: 100.0000 mg | ORAL_TABLET | Freq: Two times a day (BID) | ORAL | 0 refills | Status: DC

## 2017-12-01 NOTE — Patient Instructions (Signed)
Let me know if new symptoms or not improved.

## 2017-12-01 NOTE — Progress Notes (Signed)
  Subjective:     Patient ID: Austin Bradford., male   DOB: 10-07-1970, 47 y.o.   MRN: [id number] Chief Complaint  Patient presents with  . Abdominal Pain    RLQ onset 3 weeks and comes and goes with diarrhea   HPI States he has noticed right groin discomfort associated with trouble starting his urinary stream. Recently treated for diverticulitis with transient improvement. States stools are more formed but had one episode of diarrhea yesterday.Reports that he does have another sexual partner but uses condom. States he had negative STD screen earlier this month.  Review of Systems     Objective:   Physical Exam  Constitutional: He appears well-developed and well-nourished. No distress.  Abdominal: There is no tenderness.  Genitourinary:  Genitourinary Comments: Prostate firm but mild discomfort noted by the patient during and after exam       Assessment:    1. Dysuria: will cover for prostatitis - POCT urinalysis dipstick - doxycycline (VIBRA-TABS) 100 MG tablet; Take 1 tablet (100 mg total) by mouth 2 (two) times daily.  Dispense: 20 tablet; Refill: 0    Plan:    Further f/u if not improved or new sx.

## 2017-12-08 ENCOUNTER — Encounter: Payer: BLUE CROSS/BLUE SHIELD | Admitting: Family Medicine

## 2017-12-10 ENCOUNTER — Ambulatory Visit
Admission: RE | Admit: 2017-12-10 | Discharge: 2017-12-10 | Disposition: A | Discharge status: Home / Self Care | Payer: BLUE CROSS/BLUE SHIELD | Source: Ambulatory Visit | Attending: Gastroenterology | Admitting: Gastroenterology

## 2017-12-10 ENCOUNTER — Other Ambulatory Visit: Payer: Self-pay | Admitting: Gastroenterology

## 2017-12-10 DIAGNOSIS — K5730 Diverticulosis of large intestine without perforation or abscess without bleeding: Secondary | ICD-10-CM | POA: Insufficient documentation

## 2017-12-10 DIAGNOSIS — K5792 Diverticulitis of intestine, part unspecified, without perforation or abscess without bleeding: Secondary | ICD-10-CM

## 2017-12-10 DIAGNOSIS — R197 Diarrhea, unspecified: Secondary | ICD-10-CM | POA: Diagnosis not present

## 2017-12-10 DIAGNOSIS — N200 Calculus of kidney: Secondary | ICD-10-CM | POA: Insufficient documentation

## 2017-12-10 DIAGNOSIS — R109 Unspecified abdominal pain: Secondary | ICD-10-CM | POA: Diagnosis not present

## 2017-12-10 MED ORDER — IOPAMIDOL (ISOVUE-300) INJECTION 61%
100.0000 mL | Freq: Once | INTRAVENOUS | Status: AC | PRN
  Administered 2017-12-10: 100 mL via INTRAVENOUS

## 2017-12-18 ENCOUNTER — Ambulatory Visit (INDEPENDENT_AMBULATORY_CARE_PROVIDER_SITE_OTHER): Payer: BLUE CROSS/BLUE SHIELD | Admitting: Family Medicine

## 2017-12-18 ENCOUNTER — Encounter: Payer: Self-pay | Admitting: Family Medicine

## 2017-12-18 VITALS — BP 114/80 | HR 67 | Temp 98.3°F | Ht 68.0 in | Wt 196.8 lb

## 2017-12-18 DIAGNOSIS — K5790 Diverticulosis of intestine, part unspecified, without perforation or abscess without bleeding: Secondary | ICD-10-CM | POA: Diagnosis not present

## 2017-12-18 DIAGNOSIS — I10 Essential (primary) hypertension: Secondary | ICD-10-CM | POA: Diagnosis not present

## 2017-12-18 DIAGNOSIS — Z87442 Personal history of urinary calculi: Secondary | ICD-10-CM

## 2017-12-18 DIAGNOSIS — Z0000 Encounter for general adult medical examination without abnormal findings: Secondary | ICD-10-CM | POA: Diagnosis not present

## 2017-12-18 DIAGNOSIS — E7800 Pure hypercholesterolemia, unspecified: Secondary | ICD-10-CM

## 2017-12-18 NOTE — Progress Notes (Signed)
Patient: Austin Fulfer., Male    DOB: 1970/12/05, 47 y.o.   MRN: [id number] Visit Date: 12/18/2017  Today's Provider: Dortha Kern, PA   Chief Complaint  Patient presents with  . Annual Exam   Subjective:    Annual physical exam Austin Bradford. is a 48 y.o. male who presents today for health maintenance and complete physical. He feels well. He reports exercising 4-5 days per week. He reports he is sleeping well.  -----------------------------------------------------------------   Review of Systems  Constitutional: Negative.   HENT: Negative.   Eyes: Negative.   Respiratory: Negative.   Cardiovascular: Negative.   Gastrointestinal: Negative.   Endocrine: Negative.   Genitourinary: Negative.   Musculoskeletal: Negative.   Skin: Negative.   Allergic/Immunologic: Negative.   Neurological: Negative.   Hematological: Negative.   Psychiatric/Behavioral: Negative.     Social History      He  reports that he has never smoked. He has never used smokeless tobacco. He reports that he drinks alcohol. He reports that he does not use drugs.       Social History   Socioeconomic History  . Marital status: Married    Spouse name: Not on file  . Number of children: Not on file  . Years of education: Not on file  . Highest education level: Not on file  Occupational History  . Not on file  Social Needs  . Financial resource strain: Not on file  . Food insecurity:    Worry: Not on file    Inability: Not on file  . Transportation needs:    Medical: Not on file    Non-medical: Not on file  Tobacco Use  . Smoking status: Never Smoker  . Smokeless tobacco: Never Used  Substance and Sexual Activity  . Alcohol use: Yes    Alcohol/week: 0.0 oz    Comment: occasionally  . Drug use: No  . Sexual activity: Not on file  Lifestyle  . Physical activity:    Days per week: Not on file    Minutes per session: Not on file  . Stress: Not on file    Relationships  . Social connections:    Talks on phone: Not on file    Gets together: Not on file    Attends religious service: Not on file    Active member of club or organization: Not on file    Attends meetings of clubs or organizations: Not on file    Relationship status: Not on file  Other Topics Concern  . Not on file  Social History Narrative  . Not on file   Past Medical History:  Diagnosis Date  . Hyperlipidemia   . Hypertension    Patient Active Problem List   Diagnosis Date Noted  . Diverticulosis 11/16/2017  . H/O renal calculi 09/07/2015  . Hypercholesterolemia without hypertriglyceridemia 07/26/2009  . Essential (primary) hypertension 09/24/2007   Past Surgical History:  Procedure Laterality Date  . NO PAST SURGERIES     Family History        Family Status  Relation Name Status  . Mother  Alive  . Father  Deceased  . Sister  Alive  . MGM  Alive  . MGF  Alive  . PGM  Deceased        His family history includes Breast cancer in his mother; CAD in his maternal grandfather; CVA in his maternal grandmother; Healthy in his sister; Heart disease in his father;  Hyperlipidemia in his father; Hypertension in his father; Ovarian cancer in his paternal grandmother; Pancreatitis in his father.     No Known Allergies  Current Outpatient Medications:  .  lisinopril (PRINIVIL,ZESTRIL) 5 MG tablet, Take 1 tablet (5 mg total) by mouth daily., Disp: 90 tablet, Rfl: 3 .  simvastatin (ZOCOR) 20 MG tablet, Take 1 tablet (20 mg total) by mouth at bedtime., Disp: 90 tablet, Rfl: 3   Patient Care Team: Shakiyah Cirilo, Jodell Cipro, PA as PCP - General (Family Medicine)      Objective:   Vitals: BP 114/80 (BP Location: Right Arm, Patient Position: Sitting, Cuff Size: Normal)   Pulse 67   Temp 98.3 F (36.8 C) (Oral)   Ht  (1.727 m)   Wt 196 lb 12.8 oz (89.3 kg)   SpO2 98%   BMI 29.92 kg/m    Physical Exam  Constitutional: He is oriented to person, place, and time. He  appears well-developed and well-nourished.  HENT:  Head: Normocephalic and atraumatic.  Right Ear: External ear normal.  Left Ear: External ear normal.  Nose: Nose normal.  Mouth/Throat: Oropharynx is clear and moist.  Eyes: Pupils are equal, round, and reactive to light. Conjunctivae and EOM are normal. Right eye exhibits no discharge.  Neck: Normal range of motion. Neck supple. No tracheal deviation present. No thyromegaly present.  Cardiovascular: Normal rate, regular rhythm, normal heart sounds and intact distal pulses.  No murmur heard. Pulmonary/Chest: Effort normal and breath sounds normal. No respiratory distress. He has no wheezes. He has no rales. He exhibits no tenderness.  Abdominal: Soft. He exhibits no distension and no mass. There is no tenderness. There is no rebound and no guarding.  Genitourinary: Rectum normal, prostate normal and penis normal.  Musculoskeletal: Normal range of motion. He exhibits no edema or tenderness.  Lymphadenopathy:    He has no cervical adenopathy.  Neurological: He is alert and oriented to person, place, and time. He has normal reflexes. He displays normal reflexes. No cranial nerve deficit. He exhibits normal muscle tone. Coordination normal.  Skin: Skin is warm and dry. No rash noted. No erythema.  Psychiatric: He has a normal mood and affect. His behavior is normal. Judgment and thought content normal.    Depression Screen PHQ 2/9 Scores 12/18/2017 12/01/2017 12/02/2016 09/13/2015  PHQ - 2 Score 0 0 0 0  PHQ- 9 Score 0 - 0 -   Assessment & Plan:     Routine Health Maintenance and Physical Exam  Exercise Activities and Dietary recommendations Goals    Continue exercise program and high fiber low fat diet.      Immunization History  Administered Date(s) Administered  . Tdap 10/14/2005, 09/13/2015    Health Maintenance  Topic Date Due  . INFLUENZA VACCINE  03/04/2018  . TETANUS/TDAP  09/12/2025  . HIV Screening  Completed    Discussed health benefits of physical activity, and encouraged him to engage in regular exercise appropriate for his age and condition.    -------------------------------------------------------------------- 1. Annual physical exam Good general health. Immunizations up to date. Given anticipatory counseling and will check routine labs. - Lipid Profile - TSH - CBC with Differential - Comprehensive Metabolic Panel (CMET)  2. Essential (primary) hypertension Well controlled and tolerating the Lisinopril 5 mg qd without side effects. Recheck routine labs and continue present dosage. - TSH - CBC with Differential - Comprehensive Metabolic Panel (CMET)  3. Diverticulosis Diagnosed with diverticulitis 12-10-17 in the sigmoid colon and followed by gastroenterologist (Dr.  Croley). Was treated with Cipro and Flagyl for a week. Will finish all the antibiotics today and follow up with GI on 12-24-17. Recheck CBC and CMP. - CBC with Differential - Comprehensive Metabolic Panel (CMET)  4. H/O renal calculi A 6 mm stone in the right kidney found on CT scan done for diverticulitis. No pain or hematuria. Recheck CMP to assess renal function. - Comprehensive Metabolic Panel (CMET)  5. Hypercholesterolemia without hypertriglyceridemia Tolerating the Simvastatin 20 mg qd without side effects. Continues to exercise and follow a low fat diet with high fiber content. Recheck labs and follow up pending reports. - Lipid Profile - TSH - Comprehensive Metabolic Panel (CMET)    Dortha Kern, PA  [facility]

## 2017-12-19 LAB — COMPREHENSIVE METABOLIC PANEL
A/G RATIO: 2 (ref 1.2–2.2)
ALT: 32 IU/L (ref 0–44)
AST: 29 IU/L (ref 0–40)
Albumin: 4.2 g/dL (ref 3.5–5.5)
Alkaline Phosphatase: 50 IU/L (ref 39–117)
BILIRUBIN TOTAL: 0.5 mg/dL (ref 0.0–1.2)
BUN/Creatinine Ratio: 10 (ref 9–20)
BUN: 10 mg/dL (ref 6–24)
CALCIUM: 9.3 mg/dL (ref 8.7–10.2)
CO2: 24 mmol/L (ref 20–29)
Chloride: 103 mmol/L (ref 96–106)
Creatinine, Ser: 1.01 mg/dL (ref 0.76–1.27)
GFR calc Af Amer: 102 mL/min/{1.73_m2} (ref >59)
GFR calc non Af Amer: 88 mL/min/{1.73_m2} (ref >59)
Globulin, Total: 2.1 g/dL (ref 1.5–4.5)
Glucose: 89 mg/dL (ref 65–99)
POTASSIUM: 3.9 mmol/L (ref 3.5–5.2)
Sodium: 141 mmol/L (ref 134–144)
Total Protein: 6.3 g/dL (ref 6.0–8.5)

## 2017-12-19 LAB — CBC WITH DIFFERENTIAL/PLATELET
BASOS ABS: 0 10*3/uL (ref 0.0–0.2)
BASOS: 0 %
EOS (ABSOLUTE): 0.1 10*3/uL (ref 0.0–0.4)
Eos: 2 %
Hematocrit: 43.8 % (ref 37.5–51.0)
Hemoglobin: 15.1 g/dL (ref 13.0–17.7)
IMMATURE GRANULOCYTES: 0 %
Immature Grans (Abs): 0 10*3/uL (ref 0.0–0.1)
LYMPHS: 32 %
Lymphocytes Absolute: 1.6 10*3/uL (ref 0.7–3.1)
MCH: 28.8 pg (ref 26.6–33.0)
MCHC: 34.5 g/dL (ref 31.5–35.7)
MCV: 84 fL (ref 79–97)
MONOS ABS: 0.6 10*3/uL (ref 0.1–0.9)
Monocytes: 12 %
Neutrophils Absolute: 2.6 10*3/uL (ref 1.4–7.0)
Neutrophils: 54 %
PLATELETS: 243 10*3/uL (ref 150–379)
RBC: 5.24 x10E6/uL (ref 4.14–5.80)
RDW: 12.9 % (ref 12.3–15.4)
WBC: 4.9 10*3/uL (ref 3.4–10.8)

## 2017-12-19 LAB — LIPID PANEL
CHOLESTEROL TOTAL: 124 mg/dL (ref 100–199)
Chol/HDL Ratio: 3.9 ratio (ref 0.0–5.0)
HDL: 32 mg/dL — AB (ref >39)
LDL Calculated: 76 mg/dL (ref 0–99)
TRIGLYCERIDES: 81 mg/dL (ref 0–149)
VLDL Cholesterol Cal: 16 mg/dL (ref 5–40)

## 2017-12-19 LAB — TSH: TSH: 2.96 u[IU]/mL (ref 0.450–4.500)

## 2017-12-21 ENCOUNTER — Telehealth: Payer: Self-pay

## 2017-12-21 NOTE — Telephone Encounter (Signed)
-----   Message from Jodell Cipro [place], [place] sent at 12/21/2017  9:54 AM EDT ----- All tests essentially normal. The exercise, diet and Simvastatin is improving good cholesterol levels. Risk ratio is down to 3.9 from 5.6. Continue the present regimen and recheck levels in a year.

## 2017-12-21 NOTE — Telephone Encounter (Signed)
Pt advised.kw 

## 2017-12-23 ENCOUNTER — Other Ambulatory Visit
Admission: RE | Admit: 2017-12-23 | Discharge: 2017-12-23 | Disposition: A | Discharge status: Home / Self Care | Payer: BLUE CROSS/BLUE SHIELD | Source: Ambulatory Visit | Attending: Gastroenterology | Admitting: Gastroenterology

## 2017-12-23 DIAGNOSIS — K5792 Diverticulitis of intestine, part unspecified, without perforation or abscess without bleeding: Secondary | ICD-10-CM | POA: Insufficient documentation

## 2017-12-23 LAB — C DIFFICILE QUICK SCREEN W PCR REFLEX
C DIFFICILE (CDIFF) TOXIN: NEGATIVE
C Diff antigen: NEGATIVE
C Diff interpretation: NOT DETECTED

## 2017-12-24 DIAGNOSIS — R195 Other fecal abnormalities: Secondary | ICD-10-CM | POA: Diagnosis not present

## 2017-12-24 DIAGNOSIS — Z8719 Personal history of other diseases of the digestive system: Secondary | ICD-10-CM | POA: Diagnosis not present

## 2017-12-24 DIAGNOSIS — K5730 Diverticulosis of large intestine without perforation or abscess without bleeding: Secondary | ICD-10-CM | POA: Diagnosis not present

## 2017-12-30 NOTE — Progress Notes (Signed)
12/31/2017 2:08 PM   Austin Rise Cashen Jr. November 12, 1970 [id number]  Referring provider: Tamsen Roers, PA [address] [place], [place] [address]  Chief Complaint  Patient presents with  . Nephrolithiasis    HPI: Patient is a 47 year old Caucasian male who presents as a referral from [employer], [place] for incidental finding of a right nonobstructing renal stone.  Contrast CT on 12/10/2017 noted an adrenal glands are unremarkable. Kidneys are without focal lesion, or hydronephrosis. 6 mm nonobstructive right renal calculus. Bladder is unremarkable.  Patient states that he has been experiencing some intermittent right-sided flank pain that radiates into his right side over the last 3 weeks.  He also noted a difficulty with urination associated with some frequency, but this is diminished.  He has had stones in the past which he had passed spontaneously.  He did state that he has not had any issues with stones for the last 10 to 12 years.  He attributes it to relocating from city water to well water.  He is unsure of his stone composition.  He states he does not recollect whether or not he underwent a metabolic work-up with his urologist Dr. Evelene Croon.   Patient denies any gross hematuria, dysuria or suprapubic/flank pain.  Patient denies any fevers, chills, nausea or vomiting.   He has been taking Advil intermittently for the flank pain which he finds helpful.  He has not noted anything that makes the pain worse.    His UA today is negative.  Patient with a history of diverticulitis.  He is experiencing nocturia x 1, which is baseline.    He does not have a family history of nephrolithiasis or prostate cancer.    PMH: Past Medical History:  Diagnosis Date  . Hyperlipidemia   . Hypertension     Surgical History: Past Surgical History:  Procedure Laterality Date  . NO PAST SURGERIES      Home Medications:  Allergies as of 12/31/2017   No Known Allergies       Medication List        Accurate as of 12/31/17  2:08 PM. Always use your most recent med list.          lisinopril 5 MG tablet Commonly known as:  PRINIVIL,ZESTRIL Take 1 tablet (5 mg total) by mouth daily.   simvastatin 20 MG tablet Commonly known as:  ZOCOR Take 1 tablet (20 mg total) by mouth at bedtime.       Allergies: No Known Allergies  Family History: Family History  Problem Relation Age of Onset  . Breast cancer Mother        history of  . Hyperlipidemia Father   . Hypertension Father   . Pancreatitis Father   . Heart disease Father   . Healthy Sister   . CVA Maternal Grandmother   . CAD Maternal Grandfather   . Ovarian cancer Paternal Grandmother     Social History:  reports that he has never smoked. He has never used smokeless tobacco. He reports that he drinks alcohol. He reports that he does not use drugs.  ROS: UROLOGY Frequent Urination?: No Hard to postpone urination?: No Burning/pain with urination?: No Get up at night to urinate?: Yes Leakage of urine?: No Urine stream starts and stops?: No Trouble starting stream?: Yes Do you have to strain to urinate?: No Blood in urine?: No Urinary tract infection?: No Sexually transmitted disease?: No Injury to kidneys or bladder?: No Painful intercourse?: No  Weak stream?: No Erection problems?: No Penile pain?: No  Gastrointestinal Nausea?: No Vomiting?: No Indigestion/heartburn?: No Diarrhea?: Yes Constipation?: No  Constitutional Fever: No Night sweats?: No Weight loss?: No Fatigue?: No  Skin Skin rash/lesions?: No Itching?: No  Eyes Blurred vision?: No Double vision?: No  Ears/Nose/Throat Sore throat?: No Sinus problems?: No  Hematologic/Lymphatic Swollen glands?: No Easy bruising?: No  Cardiovascular Leg swelling?: No Chest pain?: No  Respiratory Cough?: No Shortness of breath?: No  Endocrine Excessive thirst?: No  Musculoskeletal Back pain?: Yes Joint  pain?: No  Neurological Headaches?: No Dizziness?: No  Psychologic Depression?: No Anxiety?: No  Physical Exam: BP 130/80   Pulse 90   Ht  (1.702 m)   Wt 199 lb 1.6 oz (90.3 kg)   BMI 31.18 kg/m   Constitutional:  Well nourished. Alert and oriented, No acute distress. HEENT: [place] AT, moist mucus membranes.  Trachea midline, no masses. Cardiovascular: No clubbing, cyanosis, or edema. Respiratory: Normal respiratory effort, no increased work of breathing. Skin: No rashes, bruises or suspicious lesions. Lymph: No cervical or inguinal adenopathy. Neurologic: Grossly intact, no focal deficits, moving all 4 extremities. Psychiatric: Normal mood and affect.  Laboratory Data: Lab Results  Component Value Date   WBC 4.9 12/18/2017   HGB 15.1 12/18/2017   HCT 43.8 12/18/2017   MCV 84 12/18/2017   PLT 243 12/18/2017    Lab Results  Component Value Date   CREATININE 1.01 12/18/2017    No results found for: PSA  No results found for: TESTOSTERONE  Lab Results  Component Value Date   HGBA1C 5.7 (H) 12/02/2016    Lab Results  Component Value Date   TSH 2.960 12/18/2017       Component Value Date/Time   CHOL 124 12/18/2017 1003   HDL 32 (L) 12/18/2017 1003   CHOLHDL 3.9 12/18/2017 1003   LDLCALC 76 12/18/2017 1003    Lab Results  Component Value Date   AST 29 12/18/2017   Lab Results  Component Value Date   ALT 32 12/18/2017   No components found for: ALKALINEPHOPHATASE No components found for: BILIRUBINTOTAL  No results found for: ESTRADIOL  Urinalysis Negative.  See Epic.   I have reviewed the labs.   Pertinent Imaging: CLINICAL DATA:  Generalized abdominal pain for 3 weeks. Some diarrhea. Currently being treated for diverticulitis.  EXAM: CT ABDOMEN AND PELVIS WITH CONTRAST  TECHNIQUE: Multidetector CT imaging of the abdomen and pelvis was performed using the standard protocol following bolus administration of intravenous  contrast.  CONTRAST:  ISOVUE-300 IOPAMIDOL (ISOVUE-300) INJECTION 61%  COMPARISON:  None.  FINDINGS: Lower chest: No acute abnormality.  Hepatobiliary: No focal liver abnormality is seen. No gallstones, gallbladder wall thickening, or biliary dilatation.  Pancreas: Unremarkable. No pancreatic ductal dilatation or surrounding inflammatory changes.  Spleen: Normal in size without focal abnormality.  Adrenals/Urinary Tract: Adrenal glands are unremarkable. Kidneys are without focal lesion, or hydronephrosis. 6 mm nonobstructive right renal calculus. Bladder is unremarkable.  Stomach/Bowel: Normal appearance of the stomach, small bowel and appendix. Scattered sigmoid diverticula without evidence of diverticulitis.  Vascular/Lymphatic: No significant vascular findings are present. No enlarged abdominal or pelvic lymph nodes.  Reproductive: Prostate is unremarkable.  Other: No abdominal wall hernia or abnormality. No abdominopelvic ascites.  Musculoskeletal: L5-S1 spondylosis.  IMPRESSION: 6 mm nonobstructive right renal calculus.  Mild sigmoid diverticulosis without evidence of diverticulitis.   Electronically Signed   By: Ted Mcalpine M.D.   On: 12/10/2017 12:30  I have  independently reviewed the films with the patient  Assessment & Plan:    1. Right kidney stone Discussed with the patient that the stone is currently not causing an obstruction, at this time.  He has the option of continue to monitor the stone with serial x-rays, proceed with ESWL or undergo URS/LL/ureteral stent placement, the risks of each of these are explained as well as the success rates of each procedure and post-operative expectations He would like to schedule right ESWL I explained that ESWL is a means of pulverizing urinary stones without surgery using shockwave therapy I discussed the risks involved with ESWL consist of bruising to the skin and kidney region as a  result of the shockwave, possibility of long-term kidney damage, development of high blood pressure and damage to the bowel or long, hematuria, urinary bleeding serious enough to require transfusion or surgical repair or removal of the kidney is rare, rare chance of hematoma formation in the kidney and injuries to the spleen, liver or pancreas.  There is also the risk of urinary tract infection or any infection of the blood system or tissue.    I explained that sometimes the stone fragments can stack up in the ureter like coins, a phenomenon described as "Steinstrasse", that would result in a stent placement and/or URS for further treatment His skin to stone distance is < 15 cm and his HU < 1500 - so the ESWL should have ~80% success rate Advised to contact our office or seek treatment in the ED if becomes febrile or pain/ vomiting are difficult control in order to arrange for emergent/urgent intervention   Return for right ESWL.    Michiel Cowboy, PA-C  [facility] [address]  [address] [place], [place] [address] [phone]

## 2017-12-31 ENCOUNTER — Ambulatory Visit: Payer: BLUE CROSS/BLUE SHIELD | Admitting: Urology

## 2017-12-31 ENCOUNTER — Other Ambulatory Visit: Payer: Self-pay | Admitting: Radiology

## 2017-12-31 ENCOUNTER — Encounter: Payer: Self-pay | Admitting: Urology

## 2017-12-31 VITALS — BP 130/80 | HR 90 | Ht 67.0 in | Wt 199.1 lb

## 2017-12-31 DIAGNOSIS — N200 Calculus of kidney: Secondary | ICD-10-CM

## 2017-12-31 LAB — URINALYSIS, COMPLETE
BILIRUBIN UA: NEGATIVE
GLUCOSE, UA: NEGATIVE
Ketones, UA: NEGATIVE
Leukocytes, UA: NEGATIVE
Nitrite, UA: NEGATIVE
Protein, UA: NEGATIVE
RBC UA: NEGATIVE
Specific Gravity, UA: 1.015 (ref 1.005–1.030)
UUROB: 0.2 mg/dL (ref 0.2–1.0)
pH, UA: 7.5 (ref 5.0–7.5)

## 2018-01-02 LAB — URINE CULTURE: Organism ID, Bacteria: NO GROWTH

## 2018-01-13 MED ORDER — CIPROFLOXACIN HCL 500 MG PO TABS
500.0000 mg | ORAL_TABLET | ORAL | Status: AC
  Administered 2018-01-14: 500 mg via ORAL

## 2018-01-13 MED ORDER — DIAZEPAM 5 MG PO TABS
10.0000 mg | ORAL_TABLET | ORAL | Status: AC
  Administered 2018-01-14: 10 mg via ORAL

## 2018-01-13 MED ORDER — ONDANSETRON HCL 4 MG/2ML IJ SOLN
4.0000 mg | Freq: Once | INTRAMUSCULAR | Status: AC | PRN
  Administered 2018-01-14: 4 mg via INTRAVENOUS

## 2018-01-13 MED ORDER — SODIUM CHLORIDE 0.9 % IV SOLN
INTRAVENOUS | Status: DC
  Administered 2018-01-14: 07:00:00 via INTRAVENOUS

## 2018-01-13 MED ORDER — DIPHENHYDRAMINE HCL 25 MG PO CAPS
25.0000 mg | ORAL_CAPSULE | ORAL | Status: AC
  Administered 2018-01-14: 25 mg via ORAL

## 2018-01-14 ENCOUNTER — Other Ambulatory Visit: Payer: Self-pay

## 2018-01-14 ENCOUNTER — Ambulatory Visit
Admission: RE | Admit: 2018-01-14 | Discharge: 2018-01-14 | Disposition: A | Discharge status: Home / Self Care | Payer: BLUE CROSS/BLUE SHIELD | Source: Ambulatory Visit | Attending: Urology | Admitting: Urology

## 2018-01-14 ENCOUNTER — Encounter: Payer: Self-pay | Admitting: *Deleted

## 2018-01-14 ENCOUNTER — Ambulatory Visit: Payer: BLUE CROSS/BLUE SHIELD

## 2018-01-14 ENCOUNTER — Encounter
Admission: RE | Disposition: A | Discharge status: Home / Self Care | Payer: Self-pay | Source: Ambulatory Visit | Attending: Urology

## 2018-01-14 DIAGNOSIS — E785 Hyperlipidemia, unspecified: Secondary | ICD-10-CM | POA: Insufficient documentation

## 2018-01-14 DIAGNOSIS — N200 Calculus of kidney: Secondary | ICD-10-CM

## 2018-01-14 DIAGNOSIS — N132 Hydronephrosis with renal and ureteral calculous obstruction: Secondary | ICD-10-CM | POA: Insufficient documentation

## 2018-01-14 DIAGNOSIS — I10 Essential (primary) hypertension: Secondary | ICD-10-CM | POA: Diagnosis not present

## 2018-01-14 DIAGNOSIS — Z01818 Encounter for other preprocedural examination: Secondary | ICD-10-CM | POA: Diagnosis not present

## 2018-01-14 HISTORY — PX: EXTRACORPOREAL SHOCK WAVE LITHOTRIPSY: SHX1557

## 2018-01-14 SURGERY — LITHOTRIPSY, ESWL
Anesthesia: Moderate Sedation | Laterality: Right

## 2018-01-14 MED ORDER — ONDANSETRON HCL 4 MG/2ML IJ SOLN
INTRAMUSCULAR | Status: AC
  Administered 2018-01-14: 4 mg via INTRAVENOUS
  Filled 2018-01-14: qty 2

## 2018-01-14 MED ORDER — ALFUZOSIN HCL ER 10 MG PO TB24: 10.0000 mg | ORAL_TABLET | Freq: Every day | ORAL | 0 refills | Status: DC

## 2018-01-14 MED ORDER — DIAZEPAM 5 MG PO TABS
ORAL_TABLET | ORAL | Status: AC
  Administered 2018-01-14: 10 mg via ORAL
  Filled 2018-01-14: qty 2

## 2018-01-14 MED ORDER — DIPHENHYDRAMINE HCL 25 MG PO CAPS
ORAL_CAPSULE | ORAL | Status: AC
  Administered 2018-01-14: 25 mg via ORAL
  Filled 2018-01-14: qty 1

## 2018-01-14 MED ORDER — OXYCODONE-ACETAMINOPHEN 5-325 MG PO TABS: 1.0000 | ORAL_TABLET | Freq: Four times a day (QID) | ORAL | 0 refills | Status: DC | PRN

## 2018-01-14 MED ORDER — CIPROFLOXACIN HCL 500 MG PO TABS
ORAL_TABLET | ORAL | Status: AC
  Administered 2018-01-14: 500 mg via ORAL
  Filled 2018-01-14: qty 1

## 2018-01-14 NOTE — Discharge Instructions (Signed)
Follow [facility] discharge instruction sheet as reviewed.  AMBULATORY SURGERY  DISCHARGE INSTRUCTIONS   1) The drugs that you were given will stay in your system until tomorrow so for the next 24 hours you should not:  A) Drive an automobile B) Make any legal decisions C) Drink any alcoholic beverage   2) You may resume regular meals tomorrow.  Today it is better to start with liquids and gradually work up to solid foods.  You may eat anything you prefer, but it is better to start with liquids, then soup and crackers, and gradually work up to solid foods.   3) Please notify your doctor immediately if you have any unusual bleeding, trouble breathing, redness and pain at the surgery site, drainage, fever, or pain not relieved by medication.    4) Additional Instructions:        Please contact your physician with any problems or Same Day Surgery at [phone], Monday through Friday 6 am to 4 pm, or [place] at [facility] Main number at [phone].

## 2018-01-18 ENCOUNTER — Other Ambulatory Visit: Payer: Self-pay | Admitting: Family Medicine

## 2018-01-18 DIAGNOSIS — I10 Essential (primary) hypertension: Secondary | ICD-10-CM

## 2018-01-21 ENCOUNTER — Ambulatory Visit: Payer: BLUE CROSS/BLUE SHIELD | Admitting: Urology

## 2018-01-25 ENCOUNTER — Ambulatory Visit: Payer: BLUE CROSS/BLUE SHIELD | Admitting: Urology

## 2018-01-30 NOTE — Progress Notes (Signed)
02/01/2018 10:49 AM   Austin Bradford. [birth year] [id number]  Referring provider: Tamsen Roers, PA [address] [place], [place] [address]  Chief Complaint  Patient presents with  . Nephrolithiasis    2wk post ESWL    HPI: Patient is a 47 year old Caucasian male with a history of a right upper calyceal stone measuring 6 mm in size who underwent ESWL on January 14, 2018 with Dr. Lonna Cobb.  Background history Patient is a 47 year old Caucasian male who presents as a referral from [employer], [place] for incidental finding of a right nonobstructing renal stone.  Contrast CT on 12/10/2017 noted an adrenal glands are unremarkable. Kidneys are without focal lesion, or hydronephrosis. 6 mm nonobstructive right renal calculus. Bladder is unremarkable.  Patient states that he has been experiencing some intermittent right-sided flank pain that radiates into his right side over the last 3 weeks.  He also noted a difficulty with urination associated with some frequency, but this is diminished.  He has had stones in the past which he had passed spontaneously.  He did state that he has not had any issues with stones for the last 10 to 12 years.  He attributes it to relocating from city water to well water. He is unsure of his stone composition.  He states he does not recollect whether or not he underwent a metabolic work-up with his urologist Dr. Evelene Croon.   Patient denies any gross hematuria, dysuria or suprapubic/flank pain.  Patient denies any fevers, chills, nausea or vomiting.   He has been taking Advil intermittently for the flank pain which he finds helpful.  He has not noted anything that makes the pain worse.  His UA today is negative.  Patient with a history of diverticulitis.  He is experiencing nocturia x 1, which is baseline.  He does have a family history of prostate cancer in his paternal uncle.    He underwent right ESWL on January 14, 2018 and experienced a cardiac  arrhythmia during the procedure.  Smudging of the renal stone was noted during the procedure.  His postprocedural course has been as expected and uneventful.    KUB on 02/01/2018 noted question right upper pole renal calculi versus overlying stool projecting over the right twelfth rib  Today, he has been having right sided flank pain that radiates to the right side.  He is also having urinary frequency.  Patient denies any gross hematuria or dysuria.  He is having discomfort in the left abdomen that radiates to the left lower back.  He states is moves around.  Patient denies any fevers, chills, nausea or vomiting.     PMH: Past Medical History:  Diagnosis Date  . Hyperlipidemia   . Hypertension     Surgical History: Past Surgical History:  Procedure Laterality Date  . EXTRACORPOREAL SHOCK WAVE LITHOTRIPSY Right 01/14/2018   Procedure: EXTRACORPOREAL SHOCK WAVE LITHOTRIPSY (ESWL);  Surgeon: Riki Altes, MD;  Location: [facility] ORS;  Service: Urology;  Laterality: Right;  . NO PAST SURGERIES      Home Medications:  Allergies as of 02/01/2018   No Known Allergies     Medication List        Accurate as of 02/01/18 10:49 AM. Always use your most recent med list.          dicyclomine 10 MG capsule Commonly known as:  BENTYL TAKE 1 CAPSULE (10 MG TOTAL) BY MOUTH 4 (FOUR) TIMES DAILY AS NEEDED (FOR ABDOMINAL CRAMPING)  lisinopril 5 MG tablet Commonly known as:  PRINIVIL,ZESTRIL TAKE 1 TABLET BY MOUTH DAILY   simvastatin 20 MG tablet Commonly known as:  ZOCOR TAKE 1 TABLET BY MOUTH AT BEDTIME       Allergies: No Known Allergies  Family History: Family History  Problem Relation Age of Onset  . Breast cancer Mother        history of  . Hyperlipidemia Father   . Hypertension Father   . Pancreatitis Father   . Heart disease Father   . Healthy Sister   . CVA Maternal Grandmother   . CAD Maternal Grandfather   . Ovarian cancer Paternal Grandmother     Social History:   reports that he has never smoked. He has never used smokeless tobacco. He reports that he drinks alcohol. He reports that he does not use drugs.  ROS: UROLOGY Frequent Urination?: No Hard to postpone urination?: No Burning/pain with urination?: No Get up at night to urinate?: No Leakage of urine?: No Urine stream starts and stops?: No Trouble starting stream?: No Do you have to strain to urinate?: No Blood in urine?: No Urinary tract infection?: No Sexually transmitted disease?: No Injury to kidneys or bladder?: No Painful intercourse?: No Weak stream?: No Erection problems?: No Penile pain?: No  Gastrointestinal Nausea?: No Vomiting?: No Indigestion/heartburn?: No Diarrhea?: No Constipation?: No  Constitutional Fever: No Night sweats?: No Weight loss?: No Fatigue?: No  Skin Skin rash/lesions?: No Itching?: No  Eyes Blurred vision?: No Double vision?: No  Ears/Nose/Throat Sore throat?: No Sinus problems?: No  Hematologic/Lymphatic Swollen glands?: No Easy bruising?: No  Cardiovascular Leg swelling?: No Chest pain?: No  Respiratory Cough?: No Shortness of breath?: No  Endocrine Excessive thirst?: No  Musculoskeletal Back pain?: No Joint pain?: No  Neurological Headaches?: No Dizziness?: No  Psychologic Depression?: No Anxiety?: No  Physical Exam: BP 138/78   Pulse 85   Ht 5\' 7"  (1.702 m)   Wt 200 lb (90.7 kg)   BMI 31.32 kg/m   Constitutional: Well nourished. Alert and oriented, No acute distress. HEENT: [place] AT, moist mucus membranes. Trachea midline, no masses. Cardiovascular: No clubbing, cyanosis, or edema. Respiratory: Normal respiratory effort, no increased work of breathing. Skin: No rashes, bruises or suspicious lesions. Lymph: No cervical or inguinal adenopathy. Neurologic: Grossly intact, no focal deficits, moving all 4 extremities. Psychiatric: Normal mood and affect.   Laboratory Data: Lab Results  Component Value  Date   WBC 4.9 12/18/2017   HGB 15.1 12/18/2017   HCT 43.8 12/18/2017   MCV 84 12/18/2017   PLT 243 12/18/2017    Lab Results  Component Value Date   CREATININE 1.01 12/18/2017    No results found for: PSA  No results found for: TESTOSTERONE  Lab Results  Component Value Date   HGBA1C 5.7 (H) 12/02/2016    Lab Results  Component Value Date   TSH 2.960 12/18/2017       Component Value Date/Time   CHOL 124 12/18/2017 1003   HDL 32 (L) 12/18/2017 1003   CHOLHDL 3.9 12/18/2017 1003   LDLCALC 76 12/18/2017 1003    Lab Results  Component Value Date   AST 29 12/18/2017   Lab Results  Component Value Date   ALT 32 12/18/2017   No components found for: ALKALINEPHOPHATASE No components found for: BILIRUBINTOTAL  No results found for: ESTRADIOL  I have reviewed the labs.   Pertinent Imaging: CLINICAL DATA:  47 year old male post lithotripsy 01/14/2018. History of right renal  calculi. Subsequent encounter.  EXAM: ABDOMEN - 1 VIEW  COMPARISON:  01/14/2018 plain film examination.  12/10/2017 CT.  FINDINGS: Question right upper pole renal calculi versus overlying stool projecting over the right twelfth rib.  No ureteral calculi noted.  Normal bowel gas pattern.  Bones unremarkable.  IMPRESSION: Question right upper pole renal calculi versus overlying stool projecting over the right twelfth rib.   Electronically Signed   By: Lacy DuverneySteven  Olson M.D.   On: 02/01/2018 09:29 I have independently reviewed the films with the patient  Assessment & Plan:    1. Right kidney stone S/P right ESWL on 01/14/2018  KUB on 02/01/2018 noted remaining stone fragments in the right upper pole Stone fragments sent for analysis   2. PSA screening NCCN guidelines recommend screening start at age 47  Discussed with the patient the risks/benefit of screening  Patient is interested in screening and will draw a PSA once he has recovered from ESWL  3. Cardiac  arrhythmia May of been the result of the shock wave treatment, but I have advised patient to notify his PCP as I am not familiar with cardiac risk factors and if he will need further work up for this  Return in about 1 week (around 02/08/2018) for KUB and office visit .    Michiel CowboySHANNON Yonatan Guitron, PA-C  [facility] [address]  [address] [place], [place] [address] [phone]

## 2018-02-01 ENCOUNTER — Ambulatory Visit
Admission: RE | Admit: 2018-02-01 | Discharge: 2018-02-01 | Disposition: A | Discharge status: Home / Self Care | Payer: BLUE CROSS/BLUE SHIELD | Source: Ambulatory Visit | Attending: Urology | Admitting: Urology

## 2018-02-01 ENCOUNTER — Ambulatory Visit (INDEPENDENT_AMBULATORY_CARE_PROVIDER_SITE_OTHER): Payer: BLUE CROSS/BLUE SHIELD | Admitting: Urology

## 2018-02-01 ENCOUNTER — Encounter: Payer: Self-pay | Admitting: Urology

## 2018-02-01 VITALS — BP 138/78 | HR 85 | Ht 67.0 in | Wt 200.0 lb

## 2018-02-01 DIAGNOSIS — N200 Calculus of kidney: Secondary | ICD-10-CM | POA: Diagnosis not present

## 2018-02-01 DIAGNOSIS — Z125 Encounter for screening for malignant neoplasm of prostate: Secondary | ICD-10-CM

## 2018-02-01 DIAGNOSIS — Z87442 Personal history of urinary calculi: Secondary | ICD-10-CM | POA: Diagnosis not present

## 2018-02-10 ENCOUNTER — Other Ambulatory Visit: Payer: Self-pay | Admitting: Urology

## 2018-02-15 ENCOUNTER — Ambulatory Visit: Payer: BLUE CROSS/BLUE SHIELD | Admitting: Urology

## 2018-02-16 ENCOUNTER — Encounter: Payer: Self-pay | Admitting: Family Medicine

## 2018-02-17 NOTE — Telephone Encounter (Signed)
This is a patient of Dennis's. It looks like he had some PVCs during his shock-wave therapy for kidney stones. Please advise that these heart beats are not harmful, but they could be a sign of an electrolyte or magnesium abnormality and sometimes requires medications to regular. He needs to schedule o.v. To have evaluated. It's not urgent can wait until dennis gets back next week.

## 2018-03-02 NOTE — Progress Notes (Signed)
03/04/2018 10:37 AM   Austin RiseJames [name] 03/15/1971 [id number]  Referring provider: Tamsen Roershrismon, Dennis E, [place] [address] [place], [place] [address]  Chief Complaint  Patient presents with  . Nephrolithiasis    HPI: Patient is a 47 year old Caucasian male with a history of a right upper calyceal stone measuring 6 mm in size who underwent ESWL on January 14, 2018 with Dr. Lonna CobbStoioff.  Background history Patient is a 47 year old Caucasian male who [place] [name] for incidental finding of a right nonobstructing renal stone.  Contrast CT on 12/10/2017 noted an adrenal glands are unremarkable. Kidneys are without focal lesion, or hydronephrosis. 6 mm nonobstructive right renal calculus. Bladder is unremarkable.  Patient states that he has been experiencing some intermittent right-sided flank pain that radiates into his right side over the last 3 weeks.  He also noted a difficulty with urination associated with some frequency, but this is diminished.  He has had stones in the past which he had passed spontaneously.  He did state that he has not had any issues with stones for the last 10 to 12 years.  He attributes it to relocating from city water to well water.  He is unsure of his stone composition.  He states he does not recollect whether or not he underwent a metabolic work-up with his urologist Dr. [name].   Patient denies any gross hematuria, dysuria or suprapubic/flank pain.  Patient denies any fevers, chills, nausea or vomiting.   He has been taking Advil intermittently for the flank pain which he finds helpful.  He has not noted anything that makes the pain worse.  His UA today is negative.  Patient with a history of diverticulitis.  He is experiencing nocturia x 1, which is baseline.  He does have a family history of prostate cancer in his paternal uncle.    He underwent right ESWL on January 14, 2018 and experienced a cardiac arrhythmia during the  procedure.  Smudging of the renal stone was noted during the procedure.  His postprocedural course has been as expected and uneventful.    KUB on 02/01/2018 noted question right upper pole renal calculi versus overlying stool projecting over the right twelfth rib  KUB on 03/2018 noted large amount of stool noted within the colon particularly on the right make evaluation for renal stone disease difficult. Small density noted over the right upper renal pole is again noted. This could again represent renal stone overlying stool. No other abnormalities identified. Similar findings noted on prior exam.    He is still having some discomfort in the right side.  Patient denies any gross hematuria, dysuria or suprapubic/flank pain.  Patient denies any fevers, chills, nausea or vomiting.   Stone composition was 60% calcium oxalate monohydrate, 37% calcium oxalate dihydrate and 3% calcium phosphate.    PMH: Past Medical History:  Diagnosis Date  . Hyperlipidemia   . Hypertension     Surgical History: Past Surgical History:  Procedure Laterality Date  . EXTRACORPOREAL SHOCK WAVE LITHOTRIPSY Right 01/14/2018   Procedure: EXTRACORPOREAL SHOCK WAVE LITHOTRIPSY (ESWL);  Surgeon: Riki AltesStoioff, Scott C, MD;  Location: [facility] ORS;  Service: Urology;  Laterality: Right;  . NO PAST SURGERIES      Home Medications:  Allergies as of 03/04/2018   No Known Allergies     Medication List        Accurate as of 03/04/18 10:37 AM. Always use your most recent med list.  dicyclomine 10 MG capsule Commonly known as:  BENTYL TAKE 1 CAPSULE (10 MG TOTAL) BY MOUTH 4 (FOUR) TIMES DAILY AS NEEDED (FOR ABDOMINAL CRAMPING)   lisinopril 5 MG tablet Commonly known as:  PRINIVIL,ZESTRIL TAKE 1 TABLET BY MOUTH DAILY   simvastatin 20 MG tablet Commonly known as:  ZOCOR TAKE 1 TABLET BY MOUTH AT BEDTIME       Allergies: No Known Allergies  Family History: Family History  Problem Relation Age of Onset  .  Breast cancer Mother        history of  . Hyperlipidemia Father   . Hypertension Father   . Pancreatitis Father   . Heart disease Father   . Healthy Sister   . CVA Maternal Grandmother   . CAD Maternal Grandfather   . Ovarian cancer Paternal Grandmother     Social History:  reports that he has never smoked. He has never used smokeless tobacco. He reports that he drinks alcohol. He reports that he does not use drugs.  ROS: UROLOGY Frequent Urination?: No Hard to postpone urination?: No Burning/pain with urination?: No Get up at night to urinate?: No Leakage of urine?: No Urine stream starts and stops?: No Trouble starting stream?: No Do you have to strain to urinate?: No Blood in urine?: No Urinary tract infection?: No Sexually transmitted disease?: No Injury to kidneys or bladder?: No Painful intercourse?: No Weak stream?: No Erection problems?: No Penile pain?: No  Gastrointestinal Nausea?: No Vomiting?: No Indigestion/heartburn?: No Diarrhea?: No Constipation?: No  Constitutional Fever: No Night sweats?: No Weight loss?: No Fatigue?: No  Skin Skin rash/lesions?: No Itching?: No  Eyes Blurred vision?: No Double vision?: No  Ears/Nose/Throat Sore throat?: No Sinus problems?: No  Hematologic/Lymphatic Swollen glands?: No Easy bruising?: No  Cardiovascular Leg swelling?: No Chest pain?: No  Respiratory Cough?: No Shortness of breath?: No  Endocrine Excessive thirst?: No  Musculoskeletal Back pain?: Yes Joint pain?: No  Neurological Headaches?: No Dizziness?: No  Psychologic Depression?: No Anxiety?: No  Physical Exam: BP 133/88 (BP Location: Left Arm, Patient Position: Sitting, Cuff Size: Large)   Pulse 73   Ht 5\' 7"  (1.702 m)   Wt 201 lb 3.2 oz (91.3 kg)   BMI 31.51 kg/m   Constitutional: Well nourished. Alert and oriented, No acute distress. HEENT: [place] AT, moist mucus membranes. Trachea midline, no masses. Cardiovascular:  No clubbing, cyanosis, or edema. Respiratory: Normal respiratory effort, no increased work of breathing. GI: Abdomen is soft, non tender, non distended, no abdominal masses. Liver and spleen not palpable.  No hernias appreciated.  Stool sample for occult testing is not indicated.   GU: No CVA tenderness.  No bladder fullness or masses.  Patient with circumcised phallus.  Urethral meatus is patent.  No penile discharge. No penile lesions or rashes. Scrotum without lesions, cysts, rashes and/or edema.  Testicles are located scrotally bilaterally. No masses are appreciated in the testicles. Left and right epididymis are normal. Rectal: Patient with  normal sphincter tone. Anus and perineum without scarring or rashes. No rectal masses are appreciated. Prostate is approximately 35 grams, no nodules are appreciated. Seminal vesicles are normal. Skin: No rashes, bruises or suspicious lesions. Lymph: No cervical or inguinal adenopathy. Neurologic: Grossly intact, no focal deficits, moving all 4 extremities. Psychiatric: Normal mood and affect.   Laboratory Data: PSA  1.7 in 03/05/2018  Lab Results  Component Value Date   WBC 4.9 12/18/2017   HGB 15.1 12/18/2017   HCT 43.8 12/18/2017  MCV 84 12/18/2017   PLT 243 12/18/2017    Lab Results  Component Value Date   CREATININE 1.01 12/18/2017    No results found for: PSA  No results found for: TESTOSTERONE  Lab Results  Component Value Date   HGBA1C 5.7 (H) 12/02/2016    Lab Results  Component Value Date   TSH 2.960 12/18/2017       Component Value Date/Time   CHOL 124 12/18/2017 1003   HDL 32 (L) 12/18/2017 1003   CHOLHDL 3.9 12/18/2017 1003   LDLCALC 76 12/18/2017 1003    Lab Results  Component Value Date   AST 29 12/18/2017   Lab Results  Component Value Date   ALT 32 12/18/2017   No components found for: ALKALINEPHOPHATASE No components found for: BILIRUBINTOTAL  No results found for: ESTRADIOL  I have reviewed  the labs.   Pertinent Imaging: CLINICAL DATA:  Prior lithotripsy.  Right flank pain.  EXAM: ABDOMEN - 1 VIEW  COMPARISON:  02/01/2018.  CT 12/10/2017.  FINDINGS: Soft tissue structures are unremarkable. Gas pattern is nonspecific. No bowel distention or free air. Prominent amount of stool noted throughout the colon particularly on the right making evaluation for renal stone disease difficult. Small density over the right upper renal pole is again noted. This could again represent renal stone or overlying stool. No other focal abnormalities identified. Degenerative changes thoracic spine and lumbar spine  IMPRESSION: Large amount of stool noted within the colon particularly on the right make evaluation for renal stone disease difficult. Small density noted over the right upper renal pole is again noted. This could again represent renal stone overlying stool. No other abnormalities identified. Similar findings noted on prior exam.   Electronically Signed   By: Maisie Fus  Register   On: 03/04/2018 11:56 I have independently reviewed the films  Assessment & Plan:    1. Right kidney stone S/P right ESWL on 01/14/2018  KUB on 03/04/2018 could not assess due to stool burden Stone fragments sent for analysis  Discussed taking laxative and repeating KUB vs CT Renal stone study  2. PSA screening PSA 1.7 in 03/2018 RTC in one year for PSA and exam   Return for I will call patient with results.    Michiel Cowboy, [place]-C  [facility] [address]  [address] [place], [place] [address] [phone]

## 2018-03-04 ENCOUNTER — Ambulatory Visit
Admission: RE | Admit: 2018-03-04 | Discharge: 2018-03-04 | Disposition: A | Discharge status: Home / Self Care | Payer: BLUE CROSS/BLUE SHIELD | Source: Ambulatory Visit | Attending: Urology | Admitting: Urology

## 2018-03-04 ENCOUNTER — Ambulatory Visit (INDEPENDENT_AMBULATORY_CARE_PROVIDER_SITE_OTHER): Payer: BLUE CROSS/BLUE SHIELD | Admitting: Urology

## 2018-03-04 ENCOUNTER — Encounter: Payer: Self-pay | Admitting: Urology

## 2018-03-04 VITALS — BP 133/88 | HR 73 | Ht 67.0 in | Wt 201.2 lb

## 2018-03-04 DIAGNOSIS — R194 Change in bowel habit: Secondary | ICD-10-CM | POA: Diagnosis not present

## 2018-03-04 DIAGNOSIS — R1032 Left lower quadrant pain: Secondary | ICD-10-CM | POA: Diagnosis not present

## 2018-03-04 DIAGNOSIS — Z125 Encounter for screening for malignant neoplasm of prostate: Secondary | ICD-10-CM

## 2018-03-04 DIAGNOSIS — N200 Calculus of kidney: Secondary | ICD-10-CM | POA: Diagnosis not present

## 2018-03-04 DIAGNOSIS — R109 Unspecified abdominal pain: Secondary | ICD-10-CM | POA: Diagnosis not present

## 2018-03-05 ENCOUNTER — Other Ambulatory Visit: Payer: BLUE CROSS/BLUE SHIELD

## 2018-03-05 DIAGNOSIS — N200 Calculus of kidney: Secondary | ICD-10-CM | POA: Diagnosis not present

## 2018-03-05 DIAGNOSIS — R197 Diarrhea, unspecified: Secondary | ICD-10-CM | POA: Diagnosis not present

## 2018-03-05 DIAGNOSIS — Z125 Encounter for screening for malignant neoplasm of prostate: Secondary | ICD-10-CM | POA: Diagnosis not present

## 2018-03-06 LAB — PSA: Prostate Specific Ag, Serum: 1.7 ng/mL (ref 0.0–4.0)

## 2018-03-18 ENCOUNTER — Ambulatory Visit
Admission: RE | Admit: 2018-03-18 | Discharge: 2018-03-18 | Disposition: A | Discharge status: Home / Self Care | Payer: BLUE CROSS/BLUE SHIELD | Source: Ambulatory Visit | Attending: Urology | Admitting: Urology

## 2018-03-18 ENCOUNTER — Telehealth: Payer: Self-pay

## 2018-03-18 DIAGNOSIS — N200 Calculus of kidney: Secondary | ICD-10-CM | POA: Diagnosis not present

## 2018-03-18 DIAGNOSIS — N400 Enlarged prostate without lower urinary tract symptoms: Secondary | ICD-10-CM | POA: Insufficient documentation

## 2018-03-18 DIAGNOSIS — R109 Unspecified abdominal pain: Secondary | ICD-10-CM | POA: Diagnosis not present

## 2018-03-18 NOTE — Telephone Encounter (Signed)
-----   Message from Harle BattiestShannon A McGowan, PA-C sent at 03/18/2018 12:08 PM EDT ----- Please let Austin Bradford know that his CT did not show any stones on his right side.  He has a 1 mm stone in his left kidney which does not need any treatment at this time.    His stone analysis was positive for calcium oxalate stones.  He may choose to undergo a 24 hour metabolic workup for further investigation as to why he produced the stone.    His PSA was 1.7 and we recommend that he has a prostate exam and PSA yearly.

## 2018-04-08 DIAGNOSIS — R194 Change in bowel habit: Secondary | ICD-10-CM | POA: Diagnosis not present

## 2018-04-08 DIAGNOSIS — R1032 Left lower quadrant pain: Secondary | ICD-10-CM | POA: Diagnosis not present

## 2018-04-08 DIAGNOSIS — K644 Residual hemorrhoidal skin tags: Secondary | ICD-10-CM | POA: Diagnosis not present

## 2018-04-08 DIAGNOSIS — Z8719 Personal history of other diseases of the digestive system: Secondary | ICD-10-CM | POA: Diagnosis not present

## 2018-04-19 DIAGNOSIS — K591 Functional diarrhea: Secondary | ICD-10-CM | POA: Diagnosis not present

## 2018-04-19 DIAGNOSIS — R197 Diarrhea, unspecified: Secondary | ICD-10-CM | POA: Diagnosis not present

## 2018-04-19 DIAGNOSIS — R194 Change in bowel habit: Secondary | ICD-10-CM | POA: Diagnosis not present

## 2018-04-19 DIAGNOSIS — K640 First degree hemorrhoids: Secondary | ICD-10-CM | POA: Diagnosis not present

## 2018-04-19 DIAGNOSIS — K5730 Diverticulosis of large intestine without perforation or abscess without bleeding: Secondary | ICD-10-CM | POA: Diagnosis not present

## 2018-04-19 DIAGNOSIS — R591 Generalized enlarged lymph nodes: Secondary | ICD-10-CM | POA: Diagnosis not present

## 2018-04-19 LAB — HM COLONOSCOPY

## 2018-04-28 ENCOUNTER — Other Ambulatory Visit: Payer: Self-pay

## 2018-04-28 MED ORDER — TAMSULOSIN HCL 0.4 MG PO CAPS: 0.4000 mg | ORAL_CAPSULE | Freq: Every day | ORAL | 3 refills | Status: DC

## 2018-04-29 ENCOUNTER — Other Ambulatory Visit: Payer: Self-pay | Admitting: Urology

## 2018-06-08 DIAGNOSIS — K602 Anal fissure, unspecified: Secondary | ICD-10-CM | POA: Diagnosis not present

## 2018-06-09 ENCOUNTER — Encounter: Payer: Self-pay | Admitting: Family Medicine

## 2018-06-24 DIAGNOSIS — K641 Second degree hemorrhoids: Secondary | ICD-10-CM | POA: Diagnosis not present

## 2018-07-14 DIAGNOSIS — K648 Other hemorrhoids: Secondary | ICD-10-CM | POA: Diagnosis not present

## 2018-07-14 DIAGNOSIS — K602 Anal fissure, unspecified: Secondary | ICD-10-CM | POA: Diagnosis not present

## 2018-07-27 DIAGNOSIS — K600 Acute anal fissure: Secondary | ICD-10-CM | POA: Diagnosis not present

## 2018-08-20 ENCOUNTER — Other Ambulatory Visit: Payer: Self-pay | Admitting: Urology

## 2018-08-24 DIAGNOSIS — K601 Chronic anal fissure: Secondary | ICD-10-CM | POA: Diagnosis not present

## 2018-10-20 ENCOUNTER — Telehealth: Payer: Self-pay

## 2018-10-20 DIAGNOSIS — B351 Tinea unguium: Secondary | ICD-10-CM | POA: Diagnosis not present

## 2018-10-20 DIAGNOSIS — M79674 Pain in right toe(s): Secondary | ICD-10-CM | POA: Diagnosis not present

## 2018-10-20 NOTE — Telephone Encounter (Signed)
Need to check with Maurine Minister about this.

## 2018-10-20 NOTE — Telephone Encounter (Signed)
Nurse Laveda Abbe) had called office stating that patient is in office tod see Dr. Graciela Husbands and he would like to start patient on lamisil but patient informed him that he is on a statin drug. Dr. Graciela Husbands is requesting that physician review chart to make sure its okay to prescribe lamisil to patient. KW

## 2018-10-21 NOTE — Telephone Encounter (Signed)
Lamisil can cause an extra strain on liver function. No specific interaction with the statins. He can hold the statin until finished with the Lamisil regimen. He should have liver enzymes and cholesterol rechecked in 6-8 weeks.

## 2018-10-21 NOTE — Telephone Encounter (Addendum)
Austin Bradford was advised at Dr. Odessa Fleming office. Austin Bradford stated she will notify the patient about the changes and advise him to call our to schedule an appt.

## 2018-11-11 IMAGING — CT CT ABD-PELV W/ CM
2 of 5 series · 16 of 46 positions shown, 18 images · IV contrast (iopamidol)
Comparison: None.

CLINICAL DATA: Generalized abdominal pain for 3 weeks. Some
diarrhea. Currently being treated for diverticulitis.

EXAM:
CT ABDOMEN AND PELVIS WITH CONTRAST
TECHNIQUE: Multidetector CT imaging of the abdomen and pelvis was performed
using the standard protocol following bolus administration of
intravenous contrast.
CONTRAST:  100mL [id number] IOPAMIDOL (CHNMZ0-FUU) INJECTION 61%

[Series 2: abd pelvis · axial · 0.74mm/px · z∈[-1586,-1126]mm · 13 of 104 slices shown, 15 images (1 of 2)]
[im 6/104  soft-tissue]
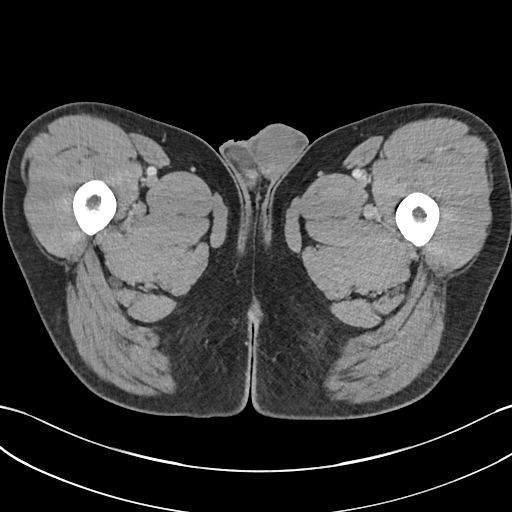
[im 6/104  bone]
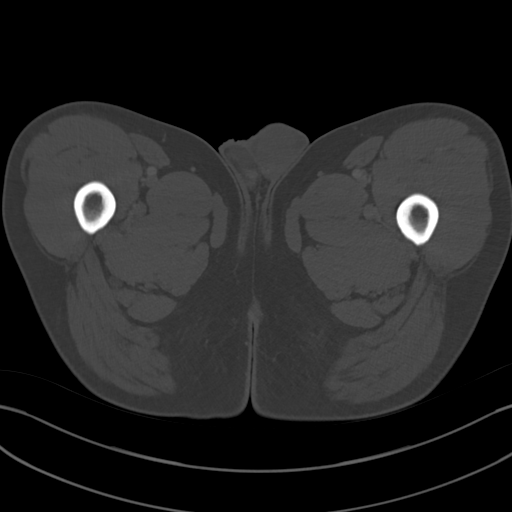
[im 12/104  soft-tissue]
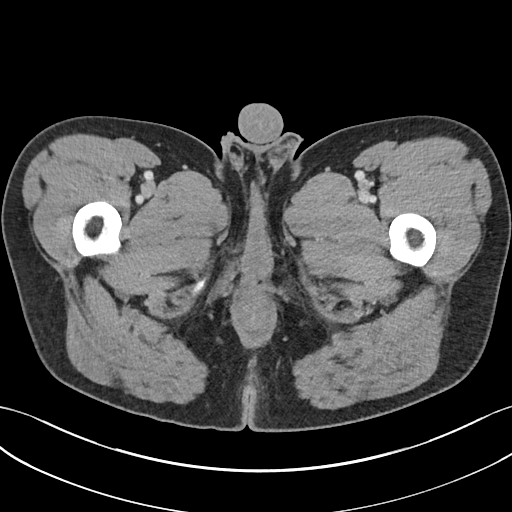
[im 23/104  soft-tissue]
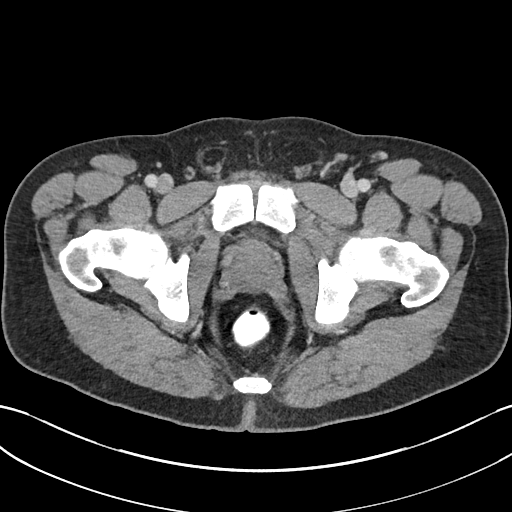
[im 29/104  soft-tissue]
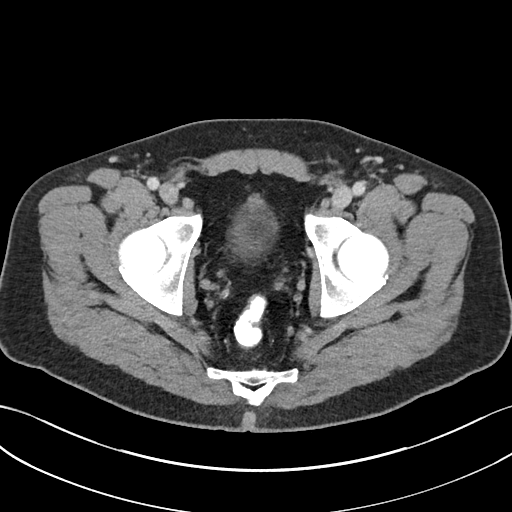
[im 35/104  soft-tissue]
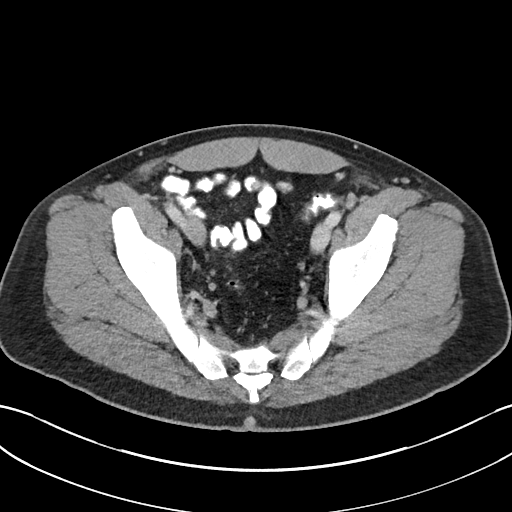
[im 46/104  soft-tissue]
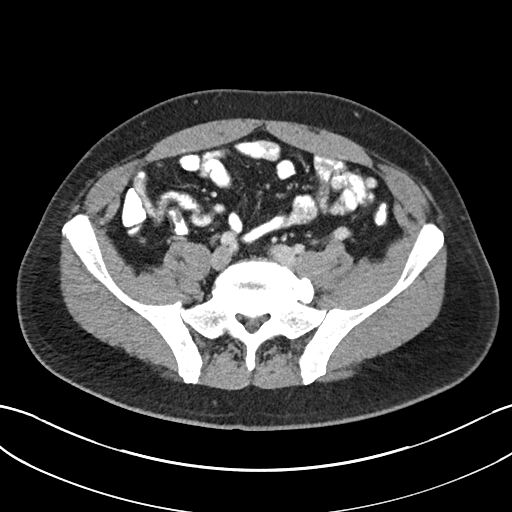
[im 52/104  soft-tissue]
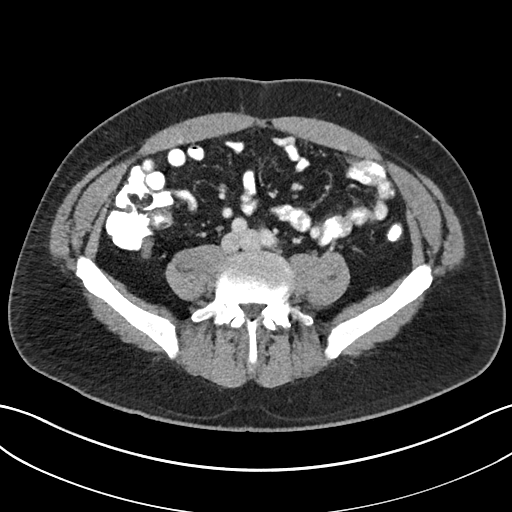
[im 58/104  soft-tissue]
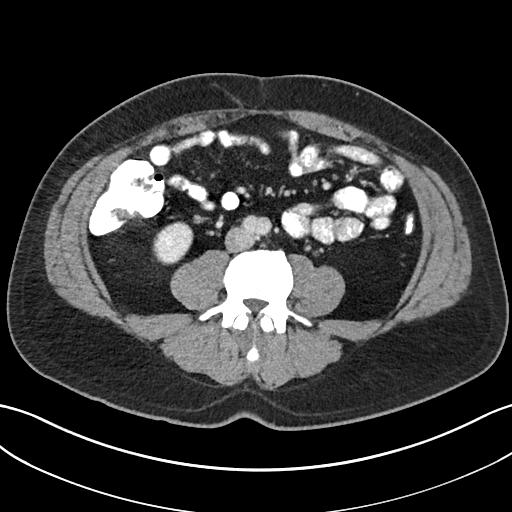
[im 69/104  soft-tissue]
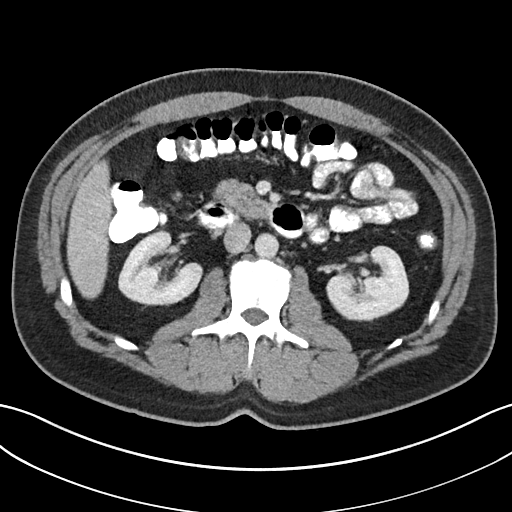
[im 69/104  bone]
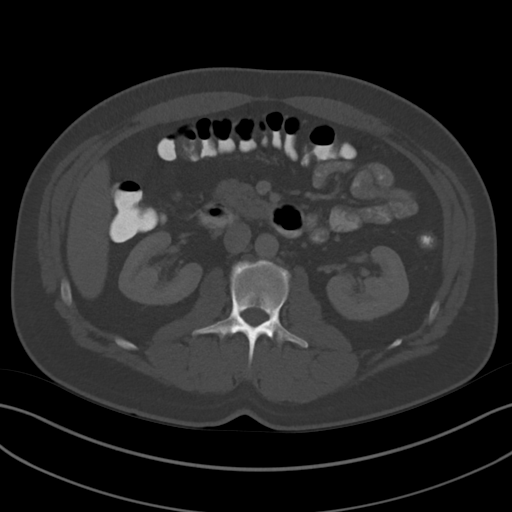
[im 75/104  soft-tissue]
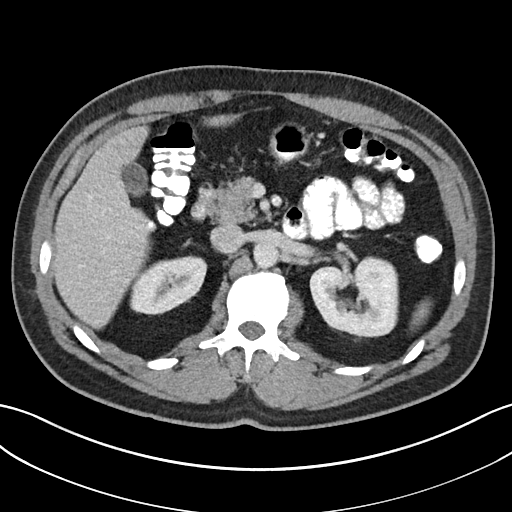
[im 81/104  soft-tissue]
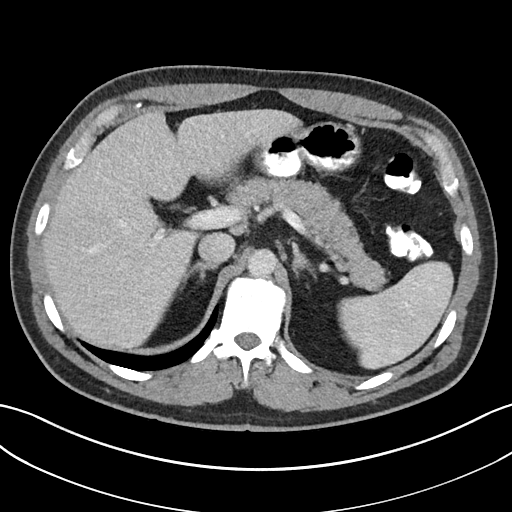
[im 92/104  soft-tissue]
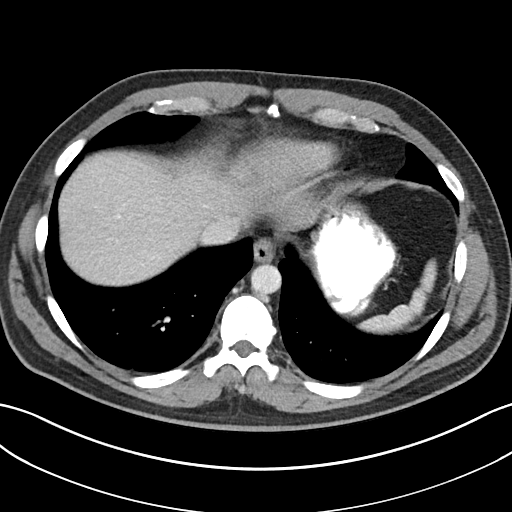
[im 98/104  soft-tissue]
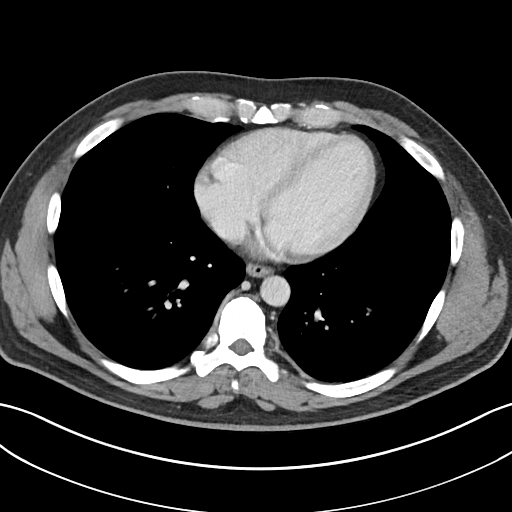

[Series 4: abd pelvis · coronal · 0.74mm/px · 3 of 136 slices shown (2 of 2)]
[im 46/136  soft-tissue]
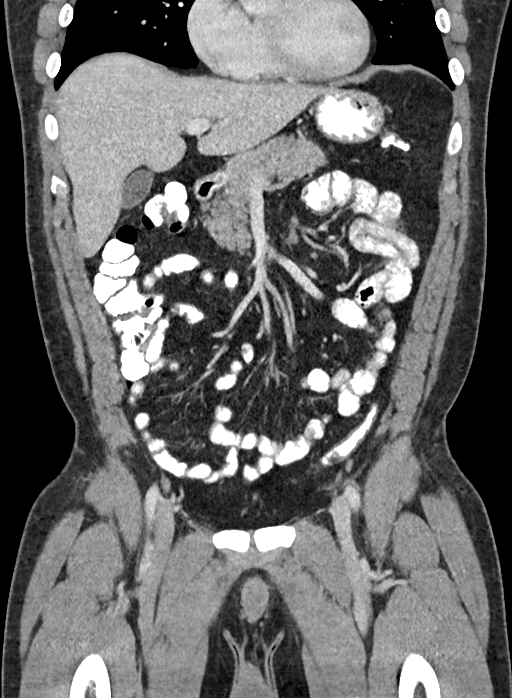
[im 61/136  soft-tissue]
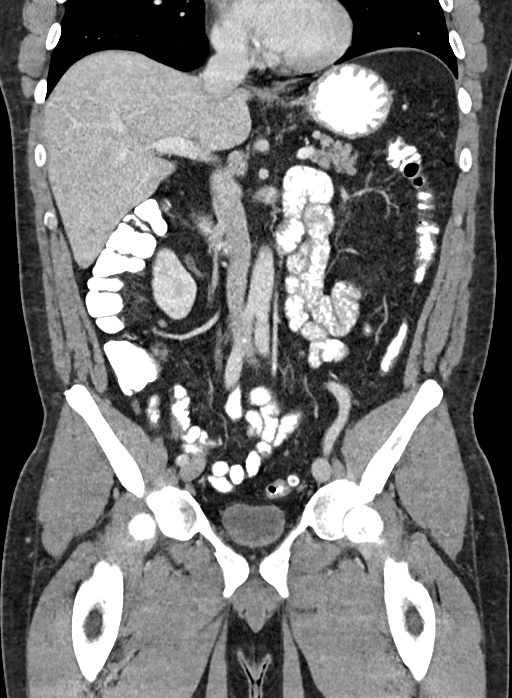
[im 76/136  soft-tissue]
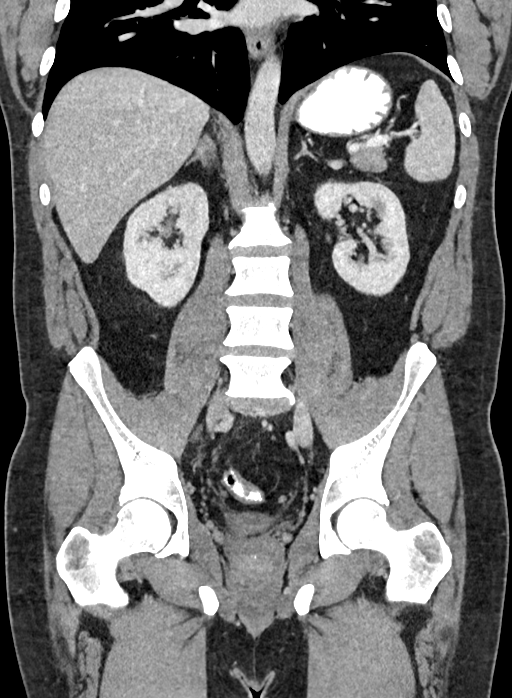

[16 of 46 positions shown; findings below may reference images not displayed]

FINDINGS: Lower chest: No acute abnormality.

Hepatobiliary: No focal liver abnormality is seen. No gallstones,
gallbladder wall thickening, or biliary dilatation.

Pancreas: Unremarkable. No pancreatic ductal dilatation or
surrounding inflammatory changes.

Spleen: Normal in size without focal abnormality.

Adrenals/Urinary Tract: Adrenal glands are unremarkable. Kidneys are
without focal lesion, or hydronephrosis. 6 mm nonobstructive right
renal calculus. Bladder is unremarkable.

Stomach/Bowel: Normal appearance of the stomach, small bowel and
appendix. Scattered sigmoid diverticula without evidence of
diverticulitis.

Vascular/Lymphatic: No significant vascular findings are present. No
enlarged abdominal or pelvic lymph nodes.

Reproductive: Prostate is unremarkable.

Other: No abdominal wall hernia or abnormality. No abdominopelvic
ascites.

Musculoskeletal: L5-S1 spondylosis.
IMPRESSION: 6 mm nonobstructive right renal calculus.

Mild sigmoid diverticulosis without evidence of diverticulitis.

## 2018-12-16 IMAGING — CR DG ABDOMEN 1V
1 series · 2 of 2 positions shown · non-contrast
Comparison: CT 12/10/2017.

CLINICAL DATA: Preprocedure lithotripsy.

EXAM:
ABDOMEN - 1 VIEW

[Series 1: dg abd 1 view · 0.14mm/px · 2 of 2 slices shown]
[im 1/2]
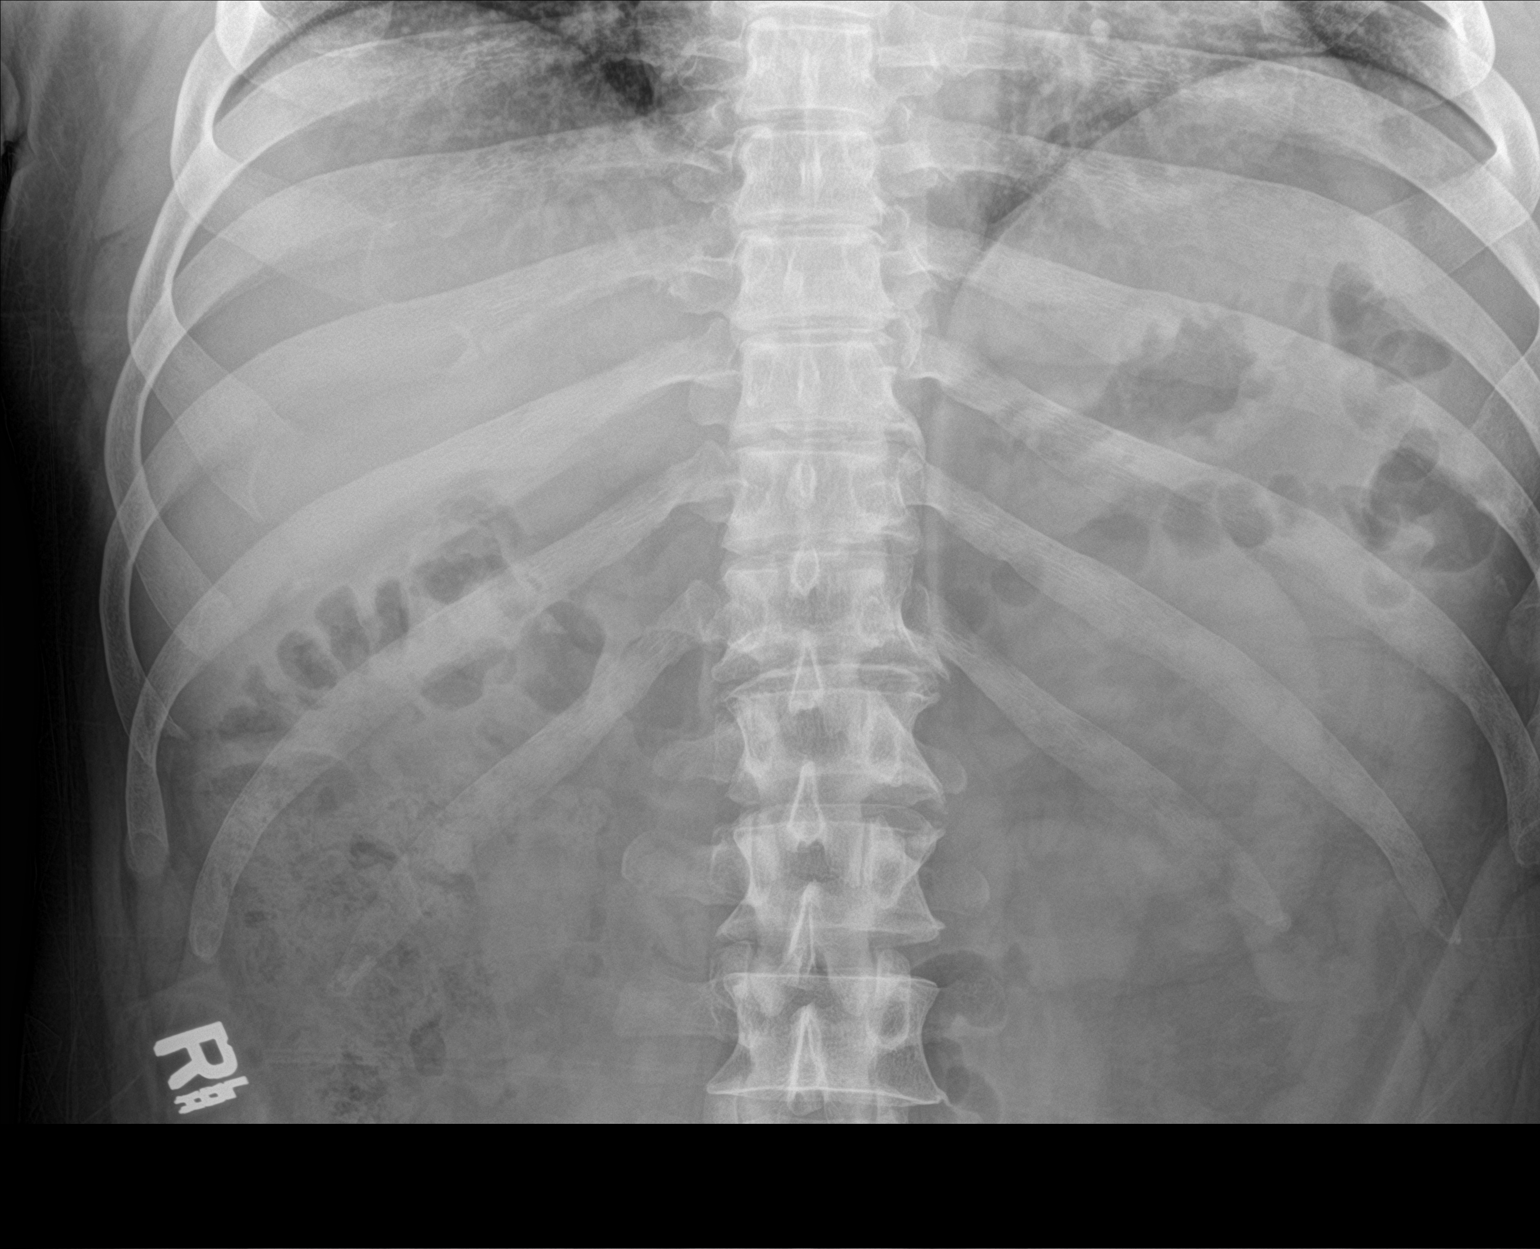
[im 2/2]
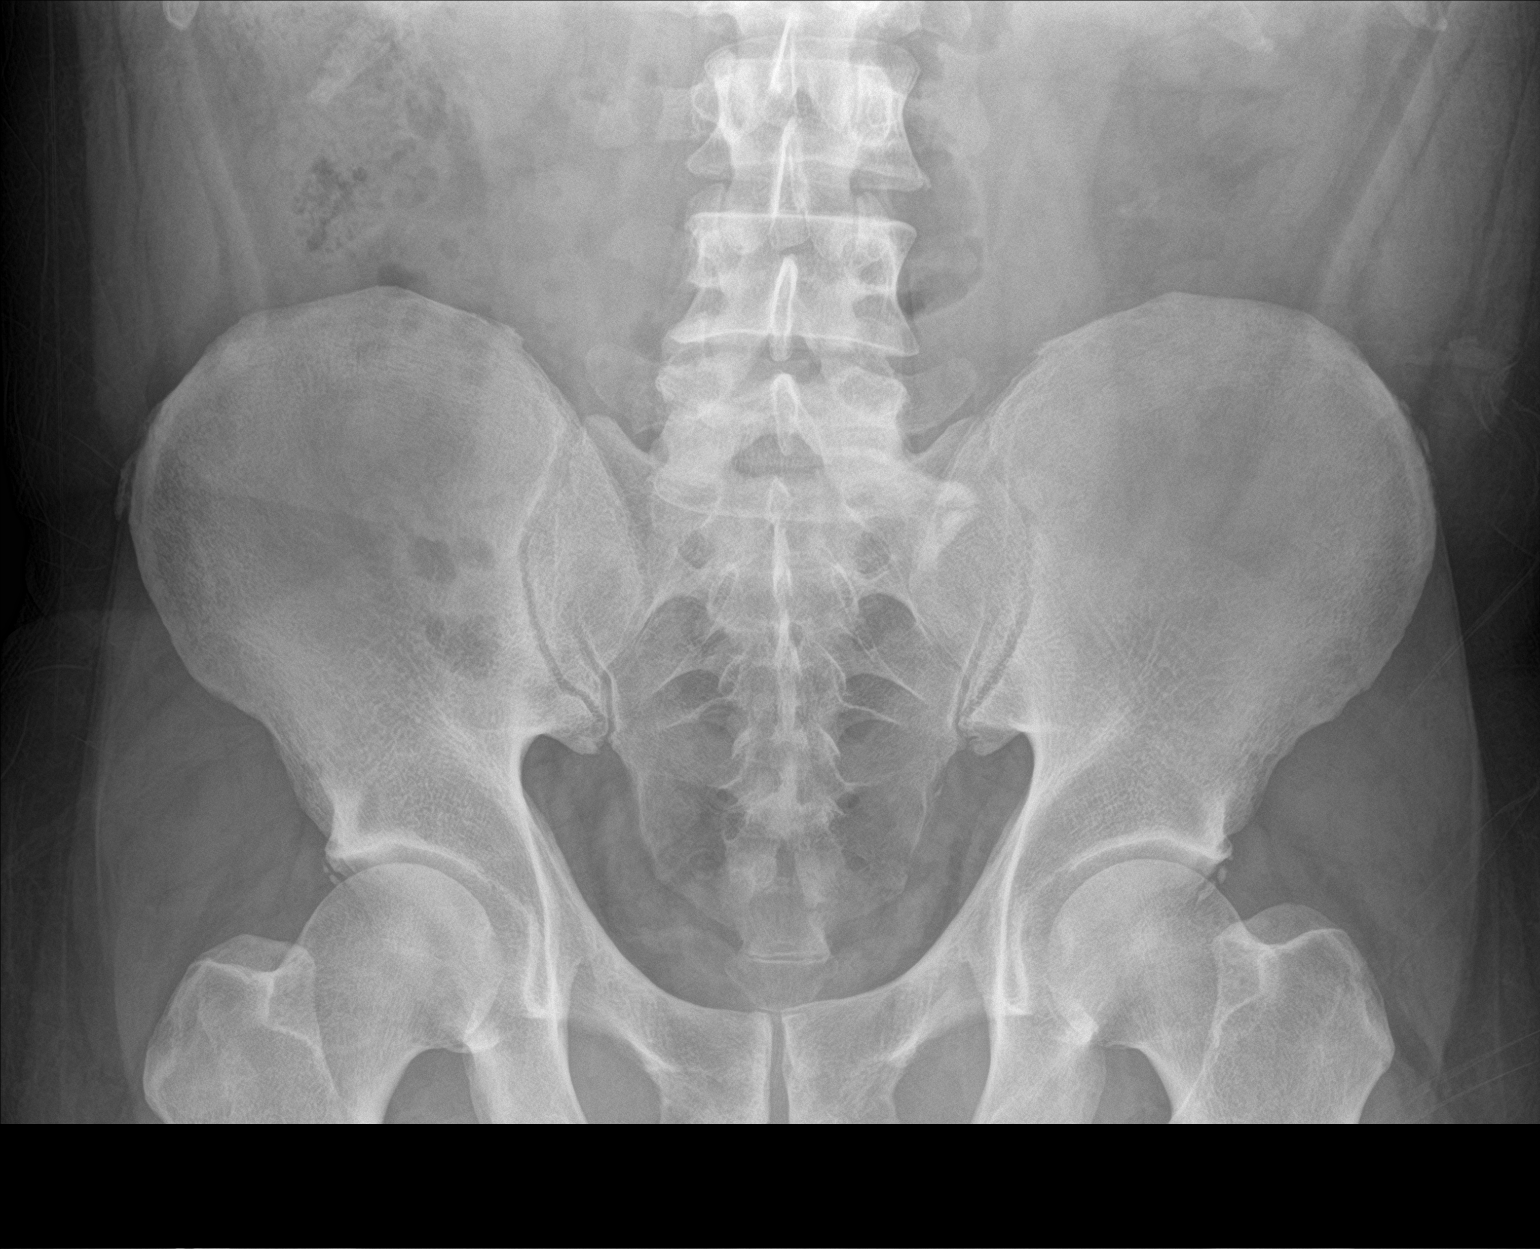

[2 of 2 positions shown; findings below may reference images not displayed]

FINDINGS: Soft tissue structures are unremarkable. No bowel distention or free
air. Stool noted throughout the colon. This makes evaluation for
renal stones difficult. Probable stone in the right kidney as
previously identified is most likely present. Degenerative change
lumbar spine and both hips.
IMPRESSION: Stool noted throughout the colon making evaluation for renal stone
noted. Probable stone in the right kidney as previously identified
is most likely present.

## 2019-02-21 ENCOUNTER — Encounter: Payer: Self-pay | Admitting: Family Medicine

## 2019-03-10 ENCOUNTER — Encounter: Payer: Self-pay | Admitting: Family Medicine

## 2019-03-10 ENCOUNTER — Other Ambulatory Visit: Payer: Self-pay

## 2019-03-10 ENCOUNTER — Ambulatory Visit (INDEPENDENT_AMBULATORY_CARE_PROVIDER_SITE_OTHER): Payer: BC Managed Care – PPO | Admitting: Family Medicine

## 2019-03-10 ENCOUNTER — Other Ambulatory Visit: Payer: Self-pay | Admitting: Family Medicine

## 2019-03-10 VITALS — BP 138/86 | HR 96 | Temp 98.2°F | Ht 67.0 in | Wt 221.0 lb

## 2019-03-10 DIAGNOSIS — Z0000 Encounter for general adult medical examination without abnormal findings: Secondary | ICD-10-CM

## 2019-03-10 DIAGNOSIS — Z87442 Personal history of urinary calculi: Secondary | ICD-10-CM | POA: Diagnosis not present

## 2019-03-10 DIAGNOSIS — I10 Essential (primary) hypertension: Secondary | ICD-10-CM | POA: Diagnosis not present

## 2019-03-10 DIAGNOSIS — Z125 Encounter for screening for malignant neoplasm of prostate: Secondary | ICD-10-CM

## 2019-03-10 DIAGNOSIS — K5790 Diverticulosis of intestine, part unspecified, without perforation or abscess without bleeding: Secondary | ICD-10-CM

## 2019-03-10 DIAGNOSIS — E7800 Pure hypercholesterolemia, unspecified: Secondary | ICD-10-CM

## 2019-03-10 NOTE — Progress Notes (Signed)
Patient: Austin CaneJames Granville Gills Jr., Male    DOB: 03/09/1971, 48 y.o.   MRN: [id number] Visit Date: 03/10/2019  Today's Provider: Dortha Kernennis Darl Brisbin, PA   Chief Complaint  Patient presents with  . Annual Exam   Subjective:  Austin CaneJames Granville Rueth Jr. is a 48 y.o. male who presents today for health maintenance and complete physical. He feels well. He reports exercising 3-4 times weekly. He reports he is sleeping well.  Immunization History  Administered Date(s) Administered  . Tdap 10/14/2005, 09/13/2015   Review of Systems  Constitutional: Negative.   HENT: Negative.   Eyes: Negative.   Respiratory: Negative.   Cardiovascular: Negative.   Gastrointestinal: Negative.   Endocrine: Negative.   Genitourinary: Negative.   Musculoskeletal: Negative.   Skin: Negative.   Allergic/Immunologic: Negative.   Neurological: Negative.   Hematological: Negative.   Psychiatric/Behavioral: Negative.    Social History   Socioeconomic History  . Marital status: Married    Spouse name: Not on file  . Number of children: Not on file  . Years of education: Not on file  . Highest education level: Not on file  Occupational History  . Not on file  Social Needs  . Financial resource strain: Not on file  . Food insecurity    Worry: Not on file    Inability: Not on file  . Transportation needs    Medical: Not on file    Non-medical: Not on file  Tobacco Use  . Smoking status: Never Smoker  . Smokeless tobacco: Never Used  Substance and Sexual Activity  . Alcohol use: Yes    Alcohol/week: 0.0 standard drinks    Comment: occasionally  . Drug use: No  . Sexual activity: Yes  Lifestyle  . Physical activity    Days per week: Not on file    Minutes per session: Not on file  . Stress: Not on file  Relationships  . Social Musicianconnections    Talks on phone: Not on file    Gets together: Not on file    Attends religious service: Not on file    Active member of club or organization: Not on file     Attends meetings of clubs or organizations: Not on file    Relationship status: Not on file  . Intimate partner violence    Fear of current or ex partner: Not on file    Emotionally abused: Not on file    Physically abused: Not on file    Forced sexual activity: Not on file  Other Topics Concern  . Not on file  Social History Narrative  . Not on file    Patient Active Problem List   Diagnosis Date Noted  . Diverticulosis 11/16/2017  . H/O renal calculi 09/07/2015  . Hypercholesterolemia without hypertriglyceridemia 07/26/2009  . Essential (primary) hypertension 09/24/2007    Past Surgical History:  Procedure Laterality Date  . EXTRACORPOREAL SHOCK WAVE LITHOTRIPSY Right 01/14/2018   Procedure: EXTRACORPOREAL SHOCK WAVE LITHOTRIPSY (ESWL);  Surgeon: Riki AltesStoioff, Scott C, MD;  Location: [facility] ORS;  Service: Urology;  Laterality: Right;  . NO PAST SURGERIES     His family history includes Breast cancer in his mother; CAD in his maternal grandfather; CVA in his maternal grandmother; Healthy in his sister; Heart disease in his father; Hyperlipidemia in his father; Hypertension in his father; Ovarian cancer in his paternal grandmother; Pancreatitis in his father.     Outpatient Encounter Medications as of 03/10/2019  Medication Sig  . lisinopril (PRINIVIL,ZESTRIL) 5 MG  tablet TAKE 1 TABLET BY MOUTH DAILY  . simvastatin (ZOCOR) 20 MG tablet TAKE 1 TABLET BY MOUTH AT BEDTIME  . dicyclomine (BENTYL) 10 MG capsule TAKE 1 CAPSULE (10 MG TOTAL) BY MOUTH 4 (FOUR) TIMES DAILY AS NEEDED (FOR ABDOMINAL CRAMPING)  . tamsulosin (FLOMAX) 0.4 MG CAPS capsule TAKE 1 CAPSULE BY MOUTH EVERY DAY (Patient not taking: Reported on 03/10/2019)   No facility-administered encounter medications on file as of 03/10/2019.    Patient Care Team: Shanyiah Conde, Vickki Muff, PA as PCP - General (Family Medicine)      Objective:   Vitals:  Vitals:   03/10/19 1341  BP: 138/86  Pulse: 96  Temp: 98.2 F (36.8 C)  TempSrc:  Oral  SpO2: 99%  Weight: 221 lb (100.2 kg)  Height: 5\' 7"  (1.702 m)   Wt Readings from Last 3 Encounters:  03/10/19 221 lb (100.2 kg)  03/04/18 201 lb 3.2 oz (91.3 kg)  02/01/18 200 lb (90.7 kg)   BP Readings from Last 3 Encounters:  03/10/19 138/86  03/04/18 133/88  02/01/18 138/78   Physical Exam Constitutional:      Appearance: He is well-developed.  HENT:     Head: Normocephalic and atraumatic.     Right Ear: External ear normal.     Left Ear: External ear normal.     Nose: Nose normal.  Eyes:     General:        Right eye: No discharge.     Conjunctiva/sclera: Conjunctivae normal.     Pupils: Pupils are equal, round, and reactive to light.  Neck:     Musculoskeletal: Normal range of motion and neck supple.     Thyroid: No thyromegaly.     Trachea: No tracheal deviation.  Cardiovascular:     Rate and Rhythm: Normal rate and regular rhythm.     Heart sounds: Normal heart sounds. No murmur.  Pulmonary:     Effort: Pulmonary effort is normal. No respiratory distress.     Breath sounds: Normal breath sounds. No wheezing or rales.  Chest:     Chest wall: No tenderness.  Abdominal:     General: There is no distension.     Palpations: Abdomen is soft. There is no mass.     Tenderness: There is no abdominal tenderness. There is no guarding or rebound.  Genitourinary:    Comments: History of renal stones with lithotripsy 2019. No recurrences. Good urine flow without nocturia. Musculoskeletal: Normal range of motion.        General: No tenderness.  Lymphadenopathy:     Cervical: No cervical adenopathy.  Skin:    General: Skin is warm and dry.     Findings: No erythema or rash.  Neurological:     Mental Status: He is alert and oriented to person, place, and time.     Cranial Nerves: No cranial nerve deficit.     Motor: No abnormal muscle tone.     Coordination: Coordination normal.     Deep Tendon Reflexes: Reflexes are normal and symmetric. Reflexes normal.   Psychiatric:        Behavior: Behavior normal.        Thought Content: Thought content normal.        Judgment: Judgment normal.     Depression Screen PHQ 2/9 Scores 03/10/2019 12/18/2017 12/01/2017 12/02/2016  PHQ - 2 Score 0 0 0 0  PHQ- 9 Score - 0 - 0   Fall Risk  03/10/2019 12/18/2017 12/01/2017  Falls in  the past year? 0 No No   Current Exercise Habits: Home exercise routine, Frequency (Times/Week): 3      [facility]  AUDIT-C Score  4     Functional Status Survey: Is the patient deaf or have difficulty hearing?: No Does the patient have difficulty seeing, even when wearing glasses/contacts?: No Does the patient have difficulty concentrating, remembering, or making decisions?: No Does the patient have difficulty walking or climbing stairs?: No Does the patient have difficulty dressing or bathing?: No Does the patient have difficulty doing errands alone such as visiting a doctor's office or shopping?: No     Assessment & Plan:     Routine Health Maintenance and Physical Exam  Exercise Activities and Dietary recommendations Goals   Continue running exercise 3 times a week and low fat diet. Should work on reduced caloric intake to help with weight loss.     Immunization History  Administered Date(s) Administered  . Tdap 10/14/2005, 09/13/2015    Health Maintenance  Topic Date Due  . INFLUENZA VACCINE  03/05/2019  . TETANUS/TDAP  09/12/2025  . HIV Screening  Completed    Discussed health benefits of physical activity, and encouraged him to engage in regular exercise appropriate for his age and condition.   1. Annual physical exam Good general health with 20+ lb weight gain the past year. Recommend he start working on calorie reduction diet and regular exercise. Immunizations up to date, had colonoscopy and prostate exam a year ago. Check routine labs and given anticipatory counseling. - CBC with Differential/Platelet -  Comprehensive metabolic panel - Lipid panel - TSH  2. Prostate cancer screening Nocturia 1-2 times a night occasionally. CT scan of abdomen and pelvis without contrast on 03-18-18 showed some borderline prostate enlargement. Will recheck CMP and PSA. No frequency, hesitancy, dribbling or decrease in urine stream.  - Comprehensive metabolic panel - PSA  3. H/O renal calculi Had lithotripsy on 01-15-19 by Dr. Lonna CobbStoioff (urology) for a 6 mm non-obstructing right renal stone. Had a follow up CT scan on 03-18-18 that showed no hydronephrosis or renal calculi. He dis have some lower lumbar impingement at L4-5 and L5-S1 from spondylosis and borderline prostatomegaly. No longer having any urinary symptoms and not taking the Flomax. Recheck renal function and signs of anemia. No pain or hematuria recently. - CBC with Differential/Platelet - Comprehensive metabolic panel  4. Essential (primary) hypertension BP stable. Tolerating Lisinopril 5 mg qd without side effects. Recheck labs and follow up pending reports. Denies chest pains, edema, dyspnea or diaphoresis. - CBC with Differential/Platelet - Comprehensive metabolic panel - Lipid panel - TSH  5. Hypercholesterolemia without hypertriglyceridemia Still taking Simvastatin 20 mg qd. Took a 3 months break while treating onychomycosis of toenails by podiatrist (Dr. Alberteen Spindleline) 10-20-18. Restarted recently. Will check labs and follow up pending reports. - Comprehensive metabolic panel - TSH - Lipid panel  6. Diverticulosis Documented on colonoscopy by Dr. Norma Fredricksonoledo (GI) on 04-20-18 with slight colitis on pathology report. No dysplasia or malignancy. Asymptomatic today. Recheck routine labs and follow up with gastroenterologist as planned. - CBC with Differential/Platelet

## 2019-03-14 DIAGNOSIS — Z87442 Personal history of urinary calculi: Secondary | ICD-10-CM | POA: Diagnosis not present

## 2019-03-14 DIAGNOSIS — Z0000 Encounter for general adult medical examination without abnormal findings: Secondary | ICD-10-CM | POA: Diagnosis not present

## 2019-03-14 DIAGNOSIS — I10 Essential (primary) hypertension: Secondary | ICD-10-CM | POA: Diagnosis not present

## 2019-03-14 DIAGNOSIS — E7800 Pure hypercholesterolemia, unspecified: Secondary | ICD-10-CM | POA: Diagnosis not present

## 2019-03-14 DIAGNOSIS — K5790 Diverticulosis of intestine, part unspecified, without perforation or abscess without bleeding: Secondary | ICD-10-CM | POA: Diagnosis not present

## 2019-03-15 ENCOUNTER — Telehealth: Payer: Self-pay

## 2019-03-15 LAB — COMPREHENSIVE METABOLIC PANEL
ALT: 25 IU/L (ref 0–44)
AST: 23 IU/L (ref 0–40)
Albumin/Globulin Ratio: 1.7 (ref 1.2–2.2)
Albumin: 4.5 g/dL (ref 4.0–5.0)
Alkaline Phosphatase: 54 IU/L (ref 39–117)
BUN/Creatinine Ratio: 11 (ref 9–20)
BUN: 11 mg/dL (ref 6–24)
Bilirubin Total: 0.5 mg/dL (ref 0.0–1.2)
CO2: 22 mmol/L (ref 20–29)
Calcium: 9.1 mg/dL (ref 8.7–10.2)
Chloride: 102 mmol/L (ref 96–106)
Creatinine, Ser: 1.03 mg/dL (ref 0.76–1.27)
GFR calc Af Amer: 99 mL/min/{1.73_m2} (ref >59)
GFR calc non Af Amer: 85 mL/min/{1.73_m2} (ref >59)
Globulin, Total: 2.6 g/dL (ref 1.5–4.5)
Glucose: 113 mg/dL — ABNORMAL HIGH (ref 65–99)
Potassium: 4.2 mmol/L (ref 3.5–5.2)
Sodium: 139 mmol/L (ref 134–144)
Total Protein: 7.1 g/dL (ref 6.0–8.5)

## 2019-03-15 LAB — CBC WITH DIFFERENTIAL/PLATELET
Basophils Absolute: 0 10*3/uL (ref 0.0–0.2)
Basos: 1 %
EOS (ABSOLUTE): 0.1 10*3/uL (ref 0.0–0.4)
Eos: 1 %
Hematocrit: 47.2 % (ref 37.5–51.0)
Hemoglobin: 16 g/dL (ref 13.0–17.7)
Immature Grans (Abs): 0 10*3/uL (ref 0.0–0.1)
Immature Granulocytes: 0 %
Lymphocytes Absolute: 1.4 10*3/uL (ref 0.7–3.1)
Lymphs: 31 %
MCH: 28.1 pg (ref 26.6–33.0)
MCHC: 33.9 g/dL (ref 31.5–35.7)
MCV: 83 fL (ref 79–97)
Monocytes Absolute: 0.6 10*3/uL (ref 0.1–0.9)
Monocytes: 12 %
Neutrophils Absolute: 2.5 10*3/uL (ref 1.4–7.0)
Neutrophils: 55 %
Platelets: 289 10*3/uL (ref 150–450)
RBC: 5.7 x10E6/uL (ref 4.14–5.80)
RDW: 12.1 % (ref 11.6–15.4)
WBC: 4.6 10*3/uL (ref 3.4–10.8)

## 2019-03-15 LAB — TSH: TSH: 1.98 u[IU]/mL (ref 0.450–4.500)

## 2019-03-15 LAB — LIPID PANEL
Chol/HDL Ratio: 4.8 ratio (ref 0.0–5.0)
Cholesterol, Total: 160 mg/dL (ref 100–199)
HDL: 33 mg/dL — ABNORMAL LOW (ref >39)
LDL Calculated: 104 mg/dL — ABNORMAL HIGH (ref 0–99)
Triglycerides: 116 mg/dL (ref 0–149)
VLDL Cholesterol Cal: 23 mg/dL (ref 5–40)

## 2019-03-15 LAB — PSA: Prostate Specific Ag, Serum: 1.6 ng/mL (ref 0.0–4.0)

## 2019-03-15 NOTE — Telephone Encounter (Signed)
-----   Message from [place], [place] sent at 03/15/2019  1:48 PM EDT ----- Blood tests essentially normal with slightly low HDL cholesterol and slight elevation of LDL cholesterol. Continue Simvastatin and should add Krill Oil one daily. Recheck BP and labs in 4-6 months.

## 2019-03-15 NOTE — Telephone Encounter (Signed)
Patient has viewed results on mychart. 03/15/2019 at 2:52 pm

## 2019-05-17 DIAGNOSIS — H524 Presbyopia: Secondary | ICD-10-CM | POA: Diagnosis not present

## 2019-05-23 ENCOUNTER — Other Ambulatory Visit: Payer: Self-pay

## 2019-05-23 ENCOUNTER — Other Ambulatory Visit: Payer: Self-pay | Admitting: Family Medicine

## 2019-05-23 DIAGNOSIS — Z20822 Contact with and (suspected) exposure to covid-19: Secondary | ICD-10-CM

## 2019-05-25 LAB — NOVEL CORONAVIRUS, NAA: SARS-CoV-2, NAA: NOT DETECTED

## 2019-06-20 ENCOUNTER — Other Ambulatory Visit: Payer: Self-pay

## 2019-06-20 DIAGNOSIS — Z20822 Contact with and (suspected) exposure to covid-19: Secondary | ICD-10-CM

## 2019-06-21 LAB — NOVEL CORONAVIRUS, NAA: SARS-CoV-2, NAA: NOT DETECTED

## 2019-06-21 LAB — INPATIENT

## 2019-08-03 DIAGNOSIS — U071 COVID-19: Secondary | ICD-10-CM | POA: Diagnosis not present

## 2019-08-03 DIAGNOSIS — Z03818 Encounter for observation for suspected exposure to other biological agents ruled out: Secondary | ICD-10-CM | POA: Diagnosis not present

## 2019-08-03 DIAGNOSIS — R6889 Other general symptoms and signs: Secondary | ICD-10-CM | POA: Diagnosis not present

## 2019-10-05 ENCOUNTER — Other Ambulatory Visit: Payer: Self-pay | Admitting: Family Medicine

## 2019-10-06 ENCOUNTER — Other Ambulatory Visit: Payer: Self-pay | Admitting: Family Medicine

## 2019-10-06 NOTE — Telephone Encounter (Signed)
Requested Prescriptions  Pending Prescriptions Disp Refills  . simvastatin (ZOCOR) 20 MG tablet [Pharmacy Med Name: SIMVASTATIN 20MG  TABLETS] 90 tablet 3    Sig: TAKE 1 TABLET BY MOUTH AT BEDTIME     Cardiovascular:  Antilipid - Statins Failed - 10/06/2019  9:04 AM      Failed - LDL in normal range and within 360 days    LDL Calculated  Date Value Ref Range Status  03/14/2019 104 (H) 0 - 99 mg/dL Final         Failed - HDL in normal range and within 360 days    HDL  Date Value Ref Range Status  03/14/2019 33 (L) >39 mg/dL Final         Failed - Valid encounter within last 12 months    Recent Outpatient Visits          7 months ago Annual physical exam   05/14/2019, [employer], [place]   1 year ago Annual physical exam   [facility], [place], [place]   1 year ago Dysuria   [facility], [place], [place]   1 year ago Left lower quadrant pain   [facility] [place], [place]   1 year ago Left lower quadrant pain   [facility] [place], [place]             Passed - Total Cholesterol in normal range and within 360 days    Cholesterol, Total  Date Value Ref Range Status  03/14/2019 160 100 - 199 mg/dL Final         Passed - Triglycerides in normal range and within 360 days    Triglycerides  Date Value Ref Range Status  03/14/2019 116 0 - 149 mg/dL Final         Passed - Patient is not pregnant

## 2019-11-21 ENCOUNTER — Ambulatory Visit: Payer: BLUE CROSS/BLUE SHIELD | Attending: Internal Medicine

## 2019-11-21 DIAGNOSIS — Z23 Encounter for immunization: Secondary | ICD-10-CM

## 2019-11-21 NOTE — Progress Notes (Signed)
   [id number] Vaccination Clinic  Name:  Austin Bradford [facility].    MRN: [id number] DOB: 01/16/1971  11/21/2019  Mr. Aten was observed post Covid-19 immunization for 15 minutes without incident. He was provided with Vaccine Information Sheet and instruction to access the V-Safe system.   Mr. Human was instructed to call 911 with any severe reactions post vaccine: Marland Kitchen Difficulty breathing  . Swelling of face and throat  . A fast heartbeat  . A bad rash all over body  . Dizziness and weakness   Immunizations Administered    Name Date Dose VIS Date Route   Pfizer COVID-19 Vaccine 11/21/2019  9:32 AM 0.3 mL 09/28/2018 Intramuscular   Manufacturer: [employer], [employer]   Lot: [id number]   NDC: [address]-7

## 2019-12-13 ENCOUNTER — Ambulatory Visit: Payer: Self-pay | Attending: Internal Medicine

## 2019-12-13 DIAGNOSIS — Z23 Encounter for immunization: Secondary | ICD-10-CM

## 2019-12-13 NOTE — Progress Notes (Signed)
   [id number] Vaccination Clinic  Name:  Austin Bradford [facility].    MRN: [id number] DOB: 12/27/1970  12/13/2019  Austin Bradford was observed post Covid-19 immunization for 15 minutes without incident. He was provided with Vaccine Information Sheet and instruction to access the V-Safe system.   Austin Bradford was instructed to call 911 with any severe reactions post vaccine: Marland Kitchen Difficulty breathing  . Swelling of face and throat  . A fast heartbeat  . A bad rash all over body  . Dizziness and weakness   Immunizations Administered    Name Date Dose VIS Date Route   Pfizer COVID-19 Vaccine 12/13/2019  9:42 AM 0.3 mL 09/28/2018 Intramuscular   Manufacturer: [employer], [employer]   Lot: [id number]   NDC: [address]-0

## 2020-03-19 ENCOUNTER — Encounter: Payer: Self-pay | Admitting: Family Medicine

## 2020-04-01 ENCOUNTER — Other Ambulatory Visit: Payer: Self-pay | Admitting: Family Medicine

## 2020-04-01 DIAGNOSIS — I10 Essential (primary) hypertension: Secondary | ICD-10-CM

## 2020-04-01 NOTE — Telephone Encounter (Signed)
Requested Prescriptions  Pending Prescriptions Disp Refills   lisinopril (ZESTRIL) 5 MG tablet [Pharmacy Med Name: LISINOPRIL TABS 5MG ] 38 tablet 0    Sig: TAKE 1 TABLET BY MOUTH DAILY     Cardiovascular:  ACE Inhibitors Failed - 04/01/2020  6:44 AM      Failed - Cr in normal range and within 180 days    Creatinine, Ser  Date Value Ref Range Status  03/14/2019 1.03 0.76 - 1.27 mg/dL Final         Failed - K in normal range and within 180 days    Potassium  Date Value Ref Range Status  03/14/2019 4.2 3.5 - 5.2 mmol/L Final         Failed - Valid encounter within last 6 months    Recent Outpatient Visits          1 year ago Annual physical exam   05/14/2019 [facility] [employer], [place]   2 years ago Annual physical exam   [facility], [employer], [facility]   2 years ago Dysuria   [facility], [place], [place]   2 years ago Left lower quadrant pain   [facility] [place], [place], [place]   2 years ago Left lower quadrant pain   [facility] [place], [place], [place]      Future Appointments            In 3 weeks [facility], [place], [place] [facility], [facility]           Passed - Patient is not pregnant      Passed - Last BP in normal range    BP Readings from Last 1 Encounters:  03/10/19 138/86

## 2020-04-24 ENCOUNTER — Other Ambulatory Visit: Payer: Self-pay

## 2020-04-24 ENCOUNTER — Ambulatory Visit (INDEPENDENT_AMBULATORY_CARE_PROVIDER_SITE_OTHER): Payer: BC Managed Care – PPO | Admitting: Family Medicine

## 2020-04-24 ENCOUNTER — Encounter: Payer: Self-pay | Admitting: Family Medicine

## 2020-04-24 VITALS — BP 132/88 | HR 75 | Temp 98.1°F | Wt 223.0 lb

## 2020-04-24 DIAGNOSIS — Z0000 Encounter for general adult medical examination without abnormal findings: Secondary | ICD-10-CM | POA: Diagnosis not present

## 2020-04-24 DIAGNOSIS — Z87442 Personal history of urinary calculi: Secondary | ICD-10-CM

## 2020-04-24 DIAGNOSIS — I10 Essential (primary) hypertension: Secondary | ICD-10-CM | POA: Diagnosis not present

## 2020-04-24 DIAGNOSIS — E7800 Pure hypercholesterolemia, unspecified: Secondary | ICD-10-CM

## 2020-04-24 DIAGNOSIS — Z1159 Encounter for screening for other viral diseases: Secondary | ICD-10-CM

## 2020-04-24 DIAGNOSIS — K5790 Diverticulosis of intestine, part unspecified, without perforation or abscess without bleeding: Secondary | ICD-10-CM

## 2020-04-24 NOTE — Progress Notes (Signed)
Complete physical exam   Patient: Austin RiseJames Granville Gap Incelms Jr.   DOB: 07/13/1971   49 y.o. Male  MRN: [id number] Visit Date: 04/24/2020  Today's healthcare provider: Dortha Kernennis Creta Dorame, PA   Chief Complaint  Patient presents with  . Annual Exam   Subjective    Austin RiseJames Granville Brownlow Montez HagemanJr. is a 49 y.o. male who presents today for a complete physical exam.  He reports consuming a general diet.  He generally feels well. He reports sleeping well. He does not have additional problems to discuss today.   Past Medical History:  Diagnosis Date  . Hyperlipidemia   . Hypertension    Past Surgical History:  Procedure Laterality Date  . EXTRACORPOREAL SHOCK WAVE LITHOTRIPSY Right 01/14/2018   Procedure: EXTRACORPOREAL SHOCK WAVE LITHOTRIPSY (ESWL);  Surgeon: Riki AltesStoioff, Scott C, MD;  Location: [facility] ORS;  Service: Urology;  Laterality: Right;  . NO PAST SURGERIES     Social History   Socioeconomic History  . Marital status: Married    Spouse name: Not on file  . Number of children: Not on file  . Years of education: Not on file  . Highest education level: Not on file  Occupational History  . Not on file  Tobacco Use  . Smoking status: Never Smoker  . Smokeless tobacco: Never Used  Vaping Use  . Vaping Use: Never used  Substance and Sexual Activity  . Alcohol use: Yes    Alcohol/week: 0.0 standard drinks    Comment: occasionally  . Drug use: No  . Sexual activity: Yes  Other Topics Concern  . Not on file  Social History Narrative  . Not on file   Social Determinants of Health   Financial Resource Strain:   . Difficulty of Paying Living Expenses: Not on file  Food Insecurity:   . Worried About Programme researcher, broadcasting/film/videounning Out of Food in the Last Year: Not on file  . Ran Out of Food in the Last Year: Not on file  Transportation Needs:   . Lack of Transportation (Medical): Not on file  . Lack of Transportation (Non-Medical): Not on file  Physical Activity:   . Days of Exercise per Week: Not on  file  . Minutes of Exercise per Session: Not on file  Stress:   . Feeling of Stress : Not on file  Social Connections:   . Frequency of Communication with Friends and Family: Not on file  . Frequency of Social Gatherings with Friends and Family: Not on file  . Attends Religious Services: Not on file  . Active Member of Clubs or Organizations: Not on file  . Attends BankerClub or Organization Meetings: Not on file  . Marital Status: Not on file  Intimate Partner Violence:   . Fear of Current or Ex-Partner: Not on file  . Emotionally Abused: Not on file  . Physically Abused: Not on file  . Sexually Abused: Not on file   Family Status  Relation Name Status  . Mother  Alive  . Father  Deceased  . Sister  Alive  . MGM  Alive  . MGF  Alive  . PGM  Deceased   Family History  Problem Relation Age of Onset  . Breast cancer Mother        history of  . Hyperlipidemia Father   . Hypertension Father   . Pancreatitis Father   . Heart disease Father   . Healthy Sister   . CVA Maternal Grandmother   . CAD Maternal Grandfather   .  Ovarian cancer Paternal Grandmother    No Known Allergies  Patient Care Team: Lue Sykora, [facility], PA as PCP - General (Family Medicine)   Medications: Outpatient Medications Prior to Visit  Medication Sig  . lisinopril (ZESTRIL) 5 MG tablet TAKE 1 TABLET BY MOUTH DAILY  . simvastatin (ZOCOR) 20 MG tablet TAKE 1 TABLET BY MOUTH AT BEDTIME  . [DISCONTINUED] dicyclomine (BENTYL) 10 MG capsule TAKE 1 CAPSULE (10 MG TOTAL) BY MOUTH 4 (FOUR) TIMES DAILY AS NEEDED (FOR ABDOMINAL CRAMPING)  . [DISCONTINUED] tamsulosin (FLOMAX) 0.4 MG CAPS capsule TAKE 1 CAPSULE BY MOUTH EVERY DAY (Patient not taking: Reported on 03/10/2019)   No facility-administered medications prior to visit.    Review of Systems  Constitutional: Negative.   HENT: Negative.   Eyes: Negative.   Respiratory: Negative.   Cardiovascular: Negative.   Gastrointestinal: Negative.   Endocrine:  Negative.   Genitourinary: Negative.   Musculoskeletal: Negative.   Skin: Negative.   Allergic/Immunologic: Negative.   Neurological: Negative.   Hematological: Negative.   Psychiatric/Behavioral: Negative.     Last metabolic panel Lab Results  Component Value Date   GLUCOSE 113 (H) 03/14/2019   NA 139 03/14/2019   K 4.2 03/14/2019   CL 102 03/14/2019   CO2 22 03/14/2019   BUN 11 03/14/2019   CREATININE 1.03 03/14/2019   GFRNONAA 85 03/14/2019   GFRAA 99 03/14/2019   CALCIUM 9.1 03/14/2019   PROT 7.1 03/14/2019   ALBUMIN 4.5 03/14/2019   LABGLOB 2.6 03/14/2019   AGRATIO 1.7 03/14/2019   BILITOT 0.5 03/14/2019   ALKPHOS 54 03/14/2019   AST 23 03/14/2019   ALT 25 03/14/2019   Last lipids Lab Results  Component Value Date   CHOL 160 03/14/2019   HDL 33 (L) 03/14/2019   LDLCALC 104 (H) 03/14/2019   TRIG 116 03/14/2019   CHOLHDL 4.8 03/14/2019      Objective    BP 132/88 (BP Location: Right Arm, Patient Position: Sitting, Cuff Size: Normal)   Pulse 75   Temp 98.1 F (36.7 C) (Oral)   Wt 223 lb (101.2 kg)   SpO2 99%   BMI 34.93 kg/m  BP Readings from Last 3 Encounters:  04/24/20 132/88  03/10/19 138/86  03/04/18 133/88   Wt Readings from Last 3 Encounters:  04/24/20 223 lb (101.2 kg)  03/10/19 221 lb (100.2 kg)  03/04/18 201 lb 3.2 oz (91.3 kg)      Physical Exam Constitutional:      Appearance: Normal appearance. He is normal weight.  HENT:     Head: Normocephalic and atraumatic.     Right Ear: Tympanic membrane, ear canal and external ear normal.     Left Ear: Tympanic membrane, ear canal and external ear normal.     Nose: Nose normal.     Mouth/Throat:     Mouth: Mucous membranes are moist.     Pharynx: Oropharynx is clear.  Eyes:     Extraocular Movements: Extraocular movements intact.     Conjunctiva/sclera: Conjunctivae normal.     Pupils: Pupils are equal, round, and reactive to light.  Cardiovascular:     Rate and Rhythm: Normal rate  and regular rhythm.     Pulses: Normal pulses.     Heart sounds: Normal heart sounds.  Pulmonary:     Effort: Pulmonary effort is normal.     Breath sounds: Normal breath sounds.  Abdominal:     General: Abdomen is flat. Bowel sounds are normal.  Palpations: Abdomen is soft.  Genitourinary:    Penis: Normal.      Testes: Normal.     Prostate: Normal.     Rectum: Normal. Guaiac result negative.  Musculoskeletal:        General: Normal range of motion.     Cervical back: Normal range of motion and neck supple.  Skin:    General: Skin is warm and dry.  Neurological:     General: No focal deficit present.     Mental Status: He is alert and oriented to person, place, and time. Mental status is at baseline.  Psychiatric:        Mood and Affect: Mood normal.        Behavior: Behavior normal.        Thought Content: Thought content normal.        Judgment: Judgment normal.    Last depression screening scores PHQ 2/9 Scores 04/24/2020 03/10/2019 12/18/2017  PHQ - 2 Score 0 0 0  PHQ- 9 Score 3 - 0   Last fall risk screening Fall Risk  04/24/2020  Falls in the past year? 0  Number falls in past yr: 0  Injury with Fall? 0   Last Audit-C alcohol use screening Alcohol Use Disorder Test (AUDIT) 04/24/2020  1. How often do you have a drink containing alcohol? 2  2. How many drinks containing alcohol do you have on a typical day when you are drinking? 0  3. How often do you have six or more drinks on one occasion? 1  AUDIT-C Score 3  Alcohol Brief Interventions/Follow-up AUDIT Score <7 follow-up not indicated   A score of 3 or more in women, and 4 or more in men indicates increased risk for alcohol abuse, EXCEPT if all of the points are from question 1   No results found for any visits on 04/24/20.  Assessment & Plan    Routine Health Maintenance and Physical Exam  Exercise Activities and Dietary recommendations Goals   Started a 2-3 day a week exercise program at the gym a  couple weeks ago.     Immunization History  Administered Date(s) Administered  . PFIZER SARS-COV-2 Vaccination 11/21/2019, 12/13/2019  . Tdap 10/14/2005, 09/13/2015    Health Maintenance  Topic Date Due  . Hepatitis C Screening  Never done  . INFLUENZA VACCINE  Never done  . TETANUS/TDAP  09/12/2025  . COVID-19 Vaccine  Completed  . HIV Screening  Completed    Discussed health benefits of physical activity, and encouraged him to engage in regular exercise appropriate for his age and condition.  1. Annual physical exam Good general health. Immunizations up to date. Given anticipatory counseling. Recheck CBC, CMP, Lipid Panel and TSH. - CBC with Differential/Platelet - Comprehensive metabolic panel - Lipid panel - TSH  2. Essential (primary) hypertension Fair control without chest pains, edema, dyspnea or palpitations. Continue Lisinopril 5 mg qd and recheck follow up labs. - CBC with Differential/Platelet - Comprehensive metabolic panel - Lipid panel - TSH  3. Hypercholesterolemia without hypertriglyceridemia Tolerating Simvastatin 20 mg qd with low fat diet. No myalgias or arthralgias. Recheck follow up labs. - Comprehensive metabolic panel - Lipid panel - TSH  4. H/O renal calculi Had lithotripsy on 01-15-19 by Dr. Lonna Cobb (urologist) for a 6 mm non-obstructing right renal stone. Asymptomatic without any recurrences.  - Comprehensive metabolic panel  5. Need for hepatitis C screening test - Hepatitis C antibody  6. Diverticulosis  Documented on colonoscopy by Dr. Norma Fredrickson (  GI) on 04-20-18 with slight colitis on pathology report. No dysplasia or malignancy. Asymptomatic today. Recheck routine labs and follow up with gastroenterologist as planned. No follow-ups on file.     Austin Pao, PA, have reviewed all documentation for this visit. The documentation on 04/24/20 for the exam, diagnosis, procedures, and orders are all accurate and complete.    Dortha Kern, PA  [facility] [phone] (phone) [phone] (fax)  [facility]

## 2020-04-25 LAB — CBC WITH DIFFERENTIAL/PLATELET
Basophils Absolute: 0 10*3/uL (ref 0.0–0.2)
Basos: 1 %
EOS (ABSOLUTE): 0.1 10*3/uL (ref 0.0–0.4)
Eos: 1 %
Hematocrit: 48 % (ref 37.5–51.0)
Hemoglobin: 16.4 g/dL (ref 13.0–17.7)
Immature Grans (Abs): 0 10*3/uL (ref 0.0–0.1)
Immature Granulocytes: 1 %
Lymphocytes Absolute: 1.6 10*3/uL (ref 0.7–3.1)
Lymphs: 25 %
MCH: 28.6 pg (ref 26.6–33.0)
MCHC: 34.2 g/dL (ref 31.5–35.7)
MCV: 84 fL (ref 79–97)
Monocytes Absolute: 0.6 10*3/uL (ref 0.1–0.9)
Monocytes: 9 %
Neutrophils Absolute: 4 10*3/uL (ref 1.4–7.0)
Neutrophils: 63 %
Platelets: 297 10*3/uL (ref 150–450)
RBC: 5.73 x10E6/uL (ref 4.14–5.80)
RDW: 12.2 % (ref 11.6–15.4)
WBC: 6.2 10*3/uL (ref 3.4–10.8)

## 2020-04-25 LAB — COMPREHENSIVE METABOLIC PANEL
ALT: 25 IU/L (ref 0–44)
AST: 20 IU/L (ref 0–40)
Albumin/Globulin Ratio: 1.6 (ref 1.2–2.2)
Albumin: 4.4 g/dL (ref 4.0–5.0)
Alkaline Phosphatase: 69 IU/L (ref 44–121)
BUN/Creatinine Ratio: 13 (ref 9–20)
BUN: 14 mg/dL (ref 6–24)
Bilirubin Total: 0.5 mg/dL (ref 0.0–1.2)
CO2: 22 mmol/L (ref 20–29)
Calcium: 9.3 mg/dL (ref 8.7–10.2)
Chloride: 103 mmol/L (ref 96–106)
Creatinine, Ser: 1.07 mg/dL (ref 0.76–1.27)
GFR calc Af Amer: 94 mL/min/{1.73_m2} (ref >59)
GFR calc non Af Amer: 81 mL/min/{1.73_m2} (ref >59)
Globulin, Total: 2.7 g/dL (ref 1.5–4.5)
Glucose: 109 mg/dL — ABNORMAL HIGH (ref 65–99)
Potassium: 3.9 mmol/L (ref 3.5–5.2)
Sodium: 144 mmol/L (ref 134–144)
Total Protein: 7.1 g/dL (ref 6.0–8.5)

## 2020-04-25 LAB — LIPID PANEL
Chol/HDL Ratio: 6.2 ratio — ABNORMAL HIGH (ref 0.0–5.0)
Cholesterol, Total: 179 mg/dL (ref 100–199)
HDL: 29 mg/dL — ABNORMAL LOW (ref >39)
LDL Chol Calc (NIH): 114 mg/dL — ABNORMAL HIGH (ref 0–99)
Triglycerides: 201 mg/dL — ABNORMAL HIGH (ref 0–149)
VLDL Cholesterol Cal: 36 mg/dL (ref 5–40)

## 2020-04-25 LAB — HEPATITIS C ANTIBODY: Hep C Virus Ab: <0.1 s/co ratio (ref 0.0–0.9)

## 2020-04-25 LAB — TSH: TSH: 2.25 u[IU]/mL (ref 0.450–4.500)

## 2020-05-02 ENCOUNTER — Telehealth: Payer: Self-pay

## 2020-05-02 NOTE — Telephone Encounter (Signed)
-----   Message from Tamsen Roers, [place] sent at 04/26/2020  1:41 PM EDT ----- All labs normal except triglycerides and LDL cholesterol high with low HDL. Recommend changing Simvastatin to Atorvastatin 40 mg qd #90 & 3 RF then recheck levels in 3 months.

## 2020-05-02 NOTE — Telephone Encounter (Signed)
LMTCB, [facility] Triage Nurse may give patient results  

## 2020-05-03 ENCOUNTER — Other Ambulatory Visit: Payer: Self-pay

## 2020-05-03 MED ORDER — ATORVASTATIN CALCIUM 40 MG PO TABS: 40.0000 mg | ORAL_TABLET | Freq: Every day | ORAL | 3 refills | Status: DC

## 2020-05-03 NOTE — Telephone Encounter (Signed)
Pt given results and recommendation per Maurine Minister Chrismon, "All labs normal except triglycerides and LDL cholesterol high with low HDL. Recommend changing Simvastatin to Atorvastatin 40 mg qd #90 & 3 RF then recheck levels in 3 months"; the pt verbalized understanding and would [facility] [address] [facility]; he can be contacted at [phone]; will route to office for notification.

## 2020-10-04 ENCOUNTER — Other Ambulatory Visit: Payer: Self-pay | Admitting: Family Medicine

## 2020-11-12 ENCOUNTER — Other Ambulatory Visit: Payer: Self-pay | Admitting: Family Medicine

## 2020-11-12 DIAGNOSIS — I10 Essential (primary) hypertension: Secondary | ICD-10-CM

## 2020-11-12 NOTE — Telephone Encounter (Signed)
Medication: lisinopril (ZESTRIL) 5 MG tablet Has the pt contacted their pharmacy? No [facility]  [id number] [place], [place] - [address]  Please be advised refills may take up to 3 business days.  We ask that you follow up with your pharmacy.

## 2020-11-12 NOTE — Telephone Encounter (Signed)
Requested medications are due for refill today.  unknown  Requested medications are on the active medications list.  yes  Last refill. 04/01/2020  Future visit scheduled.   no  Notes to clinic.  Rx on 04/01/2020 was for #38.  Please advise.

## 2020-11-13 MED ORDER — LISINOPRIL 5 MG PO TABS: 5.0000 mg | ORAL_TABLET | Freq: Every day | ORAL | 0 refills | Status: DC

## 2020-11-13 NOTE — Telephone Encounter (Signed)
Please review. Dennis pt.  

## 2020-12-31 ENCOUNTER — Other Ambulatory Visit: Payer: Self-pay | Admitting: Family Medicine

## 2020-12-31 DIAGNOSIS — I10 Essential (primary) hypertension: Secondary | ICD-10-CM

## 2021-01-01 NOTE — Telephone Encounter (Signed)
  Last refill: 11/30/2020  Future visit scheduled:no  Notes to clinic: last appt was on 04/24/2020 for physical  No follow up listed  Review for continued use    Requested Prescriptions  Pending Prescriptions Disp Refills   lisinopril (ZESTRIL) 5 MG tablet [Pharmacy Med Name: LISINOPRIL 5 MG TABLET] 19 tablet 1    Sig: Take 1 tablet (5 mg total) by mouth daily.      Cardiovascular:  ACE Inhibitors Failed - 12/31/2020  5:27 PM      Failed - Cr in normal range and within 180 days    Creatinine, Ser  Date Value Ref Range Status  04/24/2020 1.07 0.76 - 1.27 mg/dL Final          Failed - K in normal range and within 180 days    Potassium  Date Value Ref Range Status  04/24/2020 3.9 3.5 - 5.2 mmol/L Final          Failed - Valid encounter within last 6 months    Recent Outpatient Visits           8 months ago Annual physical exam   [facility] Chrismon, Jodell Cipro, PA-C   1 year ago Annual physical exam   [facility], Jodell Cipro, PA-C   3 years ago Annual physical exam   [facility], Jodell Cipro, PA-C   3 years ago Dysuria   [facility] [place], [place], [place]   3 years ago Left lower quadrant pain   [facility] [place], [place]                PXTGGY - Patient is not pregnant      Passed - Last BP in normal range    BP Readings from Last 1 Encounters:  04/24/20 132/88

## 2021-03-06 DIAGNOSIS — L578 Other skin changes due to chronic exposure to nonionizing radiation: Secondary | ICD-10-CM | POA: Diagnosis not present

## 2021-03-06 DIAGNOSIS — R58 Hemorrhage, not elsewhere classified: Secondary | ICD-10-CM | POA: Diagnosis not present

## 2021-03-06 DIAGNOSIS — D225 Melanocytic nevi of trunk: Secondary | ICD-10-CM | POA: Diagnosis not present

## 2021-03-06 DIAGNOSIS — L570 Actinic keratosis: Secondary | ICD-10-CM | POA: Diagnosis not present

## 2021-04-18 ENCOUNTER — Telehealth: Payer: Self-pay

## 2021-04-18 NOTE — Telephone Encounter (Signed)
Copied from CRM [id number]. Topic: Appointment Scheduling - Scheduling Inquiry for Clinic >> Apr 18, 2021 11:08 AM Pawlus, Gifford Shave wrote: Reason for CRM: Pt had a CPE scheduled with Dortha Kern and said he needs his CPE by the end of September, no openings until January 2023.

## 2021-04-25 ENCOUNTER — Encounter: Payer: BC Managed Care – PPO | Admitting: Family Medicine

## 2021-06-17 ENCOUNTER — Other Ambulatory Visit: Payer: Self-pay | Admitting: Family Medicine

## 2021-06-17 MED ORDER — ATORVASTATIN CALCIUM 40 MG PO TABS: 40.0000 mg | ORAL_TABLET | Freq: Every day | ORAL | 0 refills | Status: DC

## 2021-06-17 NOTE — Telephone Encounter (Signed)
Medication: atorvastatin (LIPITOR) 40 MG tablet [[id number]] ,   Has the patient contacted their pharmacy? YES  (Agent: If no, request that the patient contact the pharmacy for the refill. If patient does not wish to contact the pharmacy document the reason why and proceed with request.) (Agent: If yes, when and what did the pharmacy advise?)  Preferred Pharmacy (with phone number or street name): [facility] [phone] [place], [place] - [address] [place] [address] Phone: [phone] Fax: [phone] Hours: Not open 24 hours   Has the patient been seen for an appointment in the last year OR does the patient have an upcoming appointment? YES CPE 01/23  Agent: Please be advised that RX refills may take up to 3 business days. We ask that you follow-up with your pharmacy.

## 2021-06-17 NOTE — Telephone Encounter (Signed)
Courtesy refill given until appt on 08/12/2021.  Requested Prescriptions  Pending Prescriptions Disp Refills  . atorvastatin (LIPITOR) 40 MG tablet 60 tablet 0    Sig: Take 1 tablet (40 mg total) by mouth daily.     Cardiovascular:  Antilipid - Statins Failed - 06/17/2021  2:56 PM      Failed - Total Cholesterol in normal range and within 360 days    Cholesterol, Total  Date Value Ref Range Status  04/24/2020 179 100 - 199 mg/dL Final         Failed - LDL in normal range and within 360 days    LDL Chol Calc (NIH)  Date Value Ref Range Status  04/24/2020 114 (H) 0 - 99 mg/dL Final         Failed - HDL in normal range and within 360 days    HDL  Date Value Ref Range Status  04/24/2020 29 (L) >39 mg/dL Final         Failed - Triglycerides in normal range and within 360 days    Triglycerides  Date Value Ref Range Status  04/24/2020 201 (H) 0 - 149 mg/dL Final         Failed - Valid encounter within last 12 months    Recent Outpatient Visits          1 year ago Annual physical exam   [facility], Jodell Cipro, PA-C   2 years ago Annual physical exam   [facility], Jodell Cipro, PA-C   3 years ago Annual physical exam   [facility], Jodell Cipro, PA-C   3 years ago Dysuria   [facility] [place], [place], [place]   3 years ago Left lower quadrant pain   [facility] [place], [place], [place]      Future Appointments            In 1 month Fisher, Demetrios Isaacs, MD [facility], [facility]           Passed - Patient is not pregnant

## 2021-07-11 ENCOUNTER — Other Ambulatory Visit: Payer: Self-pay | Admitting: Family Medicine

## 2021-07-15 ENCOUNTER — Telehealth: Payer: Self-pay | Admitting: Family Medicine

## 2021-07-15 DIAGNOSIS — I10 Essential (primary) hypertension: Secondary | ICD-10-CM

## 2021-07-15 MED ORDER — LISINOPRIL 5 MG PO TABS: 5.0000 mg | ORAL_TABLET | Freq: Every day | ORAL | 0 refills | Status: DC

## 2021-07-15 NOTE — Telephone Encounter (Signed)
Patient overdue for follow up appointment. Last office visit was 04/24/2020. He has an appointment scheduled to see Dr. Sherrie Mustache on 08/12/2021.  Will give courtesy refill for 30 day supply.

## 2021-07-15 NOTE — Telephone Encounter (Signed)
[facility] following [facility]: ° °lisinopril (ZESTRIL) 5 MG tablet  ° °Please advise. ° °

## 2021-08-03 ENCOUNTER — Other Ambulatory Visit: Payer: Self-pay | Admitting: Family Medicine

## 2021-08-03 NOTE — Telephone Encounter (Signed)
Requested medication (s) are due for refill today: yes  Requested medication (s) are on the active medication list: yes  Last refill:  07/11/21 #30  Future visit scheduled: yes  Notes to clinic:  overdue lab work   Requested Prescriptions  Pending Prescriptions Disp Refills   atorvastatin (LIPITOR) 40 MG tablet [Pharmacy Med Name: ATORVASTATIN 40 MG TABLET] 30 tablet 0    Sig: TAKE 1 TABLET BY MOUTH EVERY DAY     Cardiovascular:  Antilipid - Statins Failed - 08/03/2021 12:30 PM      Failed - Total Cholesterol in normal range and within 360 days    Cholesterol, Total  Date Value Ref Range Status  04/24/2020 179 100 - 199 mg/dL Final          Failed - LDL in normal range and within 360 days    LDL Chol Calc (NIH)  Date Value Ref Range Status  04/24/2020 114 (H) 0 - 99 mg/dL Final          Failed - HDL in normal range and within 360 days    HDL  Date Value Ref Range Status  04/24/2020 29 (L) >39 mg/dL Final          Failed - Triglycerides in normal range and within 360 days    Triglycerides  Date Value Ref Range Status  04/24/2020 201 (H) 0 - 149 mg/dL Final          Failed - Valid encounter within last 12 months    Recent Outpatient Visits           1 year ago Annual physical exam   [facility], Jodell Cipro, PA-C   2 years ago Annual physical exam   [facility], Jodell Cipro, PA-C   3 years ago Annual physical exam   [facility], Jodell Cipro, PA-C   3 years ago Dysuria   [facility] [place], [place], [place]   3 years ago Left lower quadrant pain   [facility] [place], [place], [place]       Future Appointments             In 1 week Fisher, Demetrios Isaacs, MD [facility], [facility]            Passed - Patient is not pregnant

## 2021-08-12 ENCOUNTER — Ambulatory Visit (INDEPENDENT_AMBULATORY_CARE_PROVIDER_SITE_OTHER): Payer: BC Managed Care – PPO | Admitting: Family Medicine

## 2021-08-12 ENCOUNTER — Other Ambulatory Visit: Payer: Self-pay

## 2021-08-12 ENCOUNTER — Encounter: Payer: Self-pay | Admitting: Family Medicine

## 2021-08-12 VITALS — BP 136/81 | HR 90 | Temp 98.2°F | Resp 100 | Ht 67.0 in | Wt 204.0 lb

## 2021-08-12 DIAGNOSIS — I10 Essential (primary) hypertension: Secondary | ICD-10-CM | POA: Diagnosis not present

## 2021-08-12 DIAGNOSIS — E7800 Pure hypercholesterolemia, unspecified: Secondary | ICD-10-CM

## 2021-08-12 DIAGNOSIS — Z125 Encounter for screening for malignant neoplasm of prostate: Secondary | ICD-10-CM | POA: Diagnosis not present

## 2021-08-12 DIAGNOSIS — G8929 Other chronic pain: Secondary | ICD-10-CM

## 2021-08-12 DIAGNOSIS — Z23 Encounter for immunization: Secondary | ICD-10-CM | POA: Diagnosis not present

## 2021-08-12 DIAGNOSIS — M25512 Pain in left shoulder: Secondary | ICD-10-CM

## 2021-08-12 DIAGNOSIS — Z0000 Encounter for general adult medical examination without abnormal findings: Secondary | ICD-10-CM

## 2021-08-12 NOTE — Patient Instructions (Addendum)
Please review the attached list of medications and notify my office if there are any errors.   Last recorded height was 5\' 7" . Weight goal for a BMI of 27 is 172.4 pounds.   It is recommended to engage in 150 minutes of moderate exercise every week.    Please go to the lab draw station in [address] on the second floor of [facility]  when you are fasting for 8 hours. Normal hours are 8:00am to 11:30am and 1:00pm to 4:00pm Monday through Friday   I recommend that you get a flu vaccine this year. Please call our office at [phone] at your earliest convenience to schedule a flu shot.

## 2021-08-12 NOTE — Progress Notes (Signed)
Complete physical exam   Patient: Austin Bradford.   DOB: Aug 06, 1970   51 y.o. Male  MRN: [id number] Visit Date: 08/12/2021  Today's healthcare provider: Lelon Huh, MD   Chief Complaint  Patient presents with   Annual Exam   Subjective    Austin Bradford Brooke Bonito. is a 51 y.o. male who presents today for a complete physical exam.  He reports consuming a general diet.  Exercises some.  He generally feels well. He reports sleeping well.  He is also due for follow up hypertension and hyperlipidemia, doing well on current medications without adverse effects.   Lab Results  Component Value Date   CHOL 179 04/24/2020   HDL 29 (L) 04/24/2020   LDLCALC 114 (H) 04/24/2020   TRIG 201 (H) 04/24/2020   CHOLHDL 6.2 (H) 04/24/2020     He does complain of persistent and worsening left shoulder pain of the last several months or longer and is interested in referral to have it evaluated.   Past Medical History:  Diagnosis Date   Hyperlipidemia    Hypertension    Past Surgical History:  Procedure Laterality Date   EXTRACORPOREAL SHOCK WAVE LITHOTRIPSY Right 01/14/2018   Procedure: EXTRACORPOREAL SHOCK WAVE LITHOTRIPSY (ESWL);  Surgeon: Abbie Sons, MD;  Location: [facility] ORS;  Service: Urology;  Laterality: Right;   NO PAST SURGERIES     Social History   Socioeconomic History   Marital status: Married    Spouse name: Not on file   Number of children: Not on file   Years of education: Not on file   Highest education level: Not on file  Occupational History   Not on file  Tobacco Use   Smoking status: Never   Smokeless tobacco: Never  Vaping Use   Vaping Use: Never used  Substance and Sexual Activity   Alcohol use: Yes    Alcohol/week: 0.0 standard drinks    Comment: occasionally   Drug use: No   Sexual activity: Yes  Other Topics Concern   Not on file  Social History Narrative   Not on file   Social Determinants of Health   Financial Resource  Strain: Not on file  Food Insecurity: Not on file  Transportation Needs: Not on file  Physical Activity: Not on file  Stress: Not on file  Social Connections: Not on file  Intimate Partner Violence: Not on file   Family Status  Relation Name Status   Mother  Alive   Father  Deceased   Sister  Alive   MGM  Alive   MGF  Alive   [place]  Deceased   Neg Hx  (Not Specified)   Family History  Problem Relation Age of Onset   Breast cancer Mother        history of   Hyperlipidemia Father    Hypertension Father    Pancreatitis Father    Heart disease Father    Healthy Sister    CVA Maternal Grandmother    CAD Maternal Grandfather    Ovarian cancer Paternal Grandmother    Colon cancer Neg Hx    No Known Allergies  Patient Care Team: Birdie Sons, MD as PCP - General (Family Medicine)   Medications: Outpatient Medications Prior to Visit  Medication Sig   atorvastatin (LIPITOR) 40 MG tablet TAKE 1 TABLET BY MOUTH EVERY DAY   lisinopril (ZESTRIL) 5 MG tablet Take 1 tablet (5 mg total) by mouth daily. NEEDS OFFICE VISIT  No facility-administered medications prior to visit.    Review of Systems  Constitutional: Negative.   HENT: Negative.    Eyes: Negative.   Respiratory: Negative.    Cardiovascular: Negative.   Gastrointestinal: Negative.   Endocrine: Negative.   Genitourinary: Negative.   Musculoskeletal: Negative.   Skin: Negative.   Allergic/Immunologic: Negative.   Neurological: Negative.   Hematological: Negative.   Psychiatric/Behavioral: Negative.       Objective    BP 136/81 (BP Location: Right Arm, Patient Position: Sitting, Cuff Size: Large)    Pulse 90    Temp 98.2 F (36.8 C) (Oral)    Resp (!) 100    Ht 5\' 7"  (1.702 m)    Wt 204 lb (92.5 kg)    BMI 31.95 kg/m    Physical Exam   General Appearance:    Mildly obese male. Alert, cooperative, in no acute distress, appears stated age  Head:    Normocephalic, without obvious abnormality, atraumatic   Eyes:    PERRL, conjunctiva/corneas clear, EOM's intact, fundi    benign, both eyes       Ears:    Normal TM's and external ear canals, both ears  Neck:   Supple, symmetrical, trachea midline, no adenopathy;       thyroid:  No enlargement/tenderness/nodules; no carotid   bruit or JVD  Back:     Symmetric, no curvature, ROM normal, no CVA tenderness  Lungs:     Clear to auscultation bilaterally, respirations unlabored  Chest wall:    No tenderness or deformity  Heart:    Normal heart rate. Normal rhythm. No murmurs, rubs, or gallops.  S1 and S2 normal  Abdomen:     Soft, non-tender, bowel sounds active all four quadrants,    no masses, no organomegaly  Genitalia:    deferred  Rectal:    deferred  Extremities:   All extremities are intact. No cyanosis or edema  Pulses:   2+ and symmetric all extremities  Skin:   Skin color, texture, turgor normal, no rashes or lesions  Lymph nodes:   Cervical, supraclavicular, and axillary nodes normal  Neurologic:   CNII-XII intact. Normal strength, sensation and reflexes      throughout     Last depression screening scores PHQ 2/9 Scores 08/12/2021 04/24/2020 03/10/2019  PHQ - 2 Score 0 0 0  PHQ- 9 Score 0 3 -   Last fall risk screening Fall Risk  08/12/2021  Falls in the past year? 0  Number falls in past yr: 0  Injury with Fall? 0  Risk for fall due to : No Fall Risks  Follow up Falls evaluation completed   Last Audit-C alcohol use screening Alcohol Use Disorder Test (AUDIT) 08/12/2021  1. How often do you have a drink containing alcohol? 2  2. How many drinks containing alcohol do you have on a typical day when you are drinking? 0  3. How often do you have six or more drinks on one occasion? 0  AUDIT-C Score 2  Alcohol Brief Interventions/Follow-up -   A score of 3 or more in women, and 4 or more in men indicates increased risk for alcohol abuse, EXCEPT if all of the points are from question 1   No results found for any visits on 08/12/21.   Assessment & Plan    Routine Health Maintenance and Physical Exam  Exercise Activities and Dietary recommendations  Goals   None     Immunization History  Administered Date(s) Administered  PFIZER(Purple Top)SARS-COV-2 Vaccination 11/21/2019, 12/13/2019   Tdap 10/14/2005, 09/13/2015    Health Maintenance  Topic Date Due   COVID-19 Vaccine (3 - Booster for Pfizer series) 02/07/2020   INFLUENZA VACCINE  11/01/2021 (Originally 03/04/2021)   Zoster Vaccines- Shingrix (2 of 2) 10/07/2021   TETANUS/TDAP  09/12/2025   COLONOSCOPY (Pts 45-27yrs Insurance coverage will need to be confirmed)  04/19/2028   Hepatitis C Screening  Completed   HIV Screening  Completed   Pneumococcal Vaccine 67-77 Years old  Aged Out   HPV VACCINES  Aged Out    Discussed health benefits of physical activity, and encouraged him to engage in regular exercise appropriate for his age and condition.  Sent for copy of last colonoscopy done in 2019 by Dr. Alice Reichert.   1. Need for shingles vaccine  - Varicella-zoster vaccine IM (Shingrix) #1  2. Prostate cancer screening  - PSA Total (Reflex To Free) ([facility] only)  3. Essential (primary) hypertension Well controlled.  Continue current medications.   - EKG 12-Lead  4. Hypercholesterolemia without hypertriglyceridemia He is tolerating atorvastatin well with no adverse effects.   - Comprehensive metabolic panel - Lipid panel - CBC  5. Chronic left shoulder pain Refer orthopedics.    Flu vaccine was recommended but refused by patient     The entirety of the information documented in the History of Present Illness, Review of Systems and Physical Exam were personally obtained by me. Portions of this information were initially documented by the CMA and reviewed by me for thoroughness and accuracy.     Lelon Huh, MD  [facility] [phone] (phone) [phone] (fax)  [place]

## 2021-08-13 ENCOUNTER — Other Ambulatory Visit: Payer: Self-pay | Admitting: Family Medicine

## 2021-08-13 DIAGNOSIS — I10 Essential (primary) hypertension: Secondary | ICD-10-CM

## 2021-08-13 NOTE — Telephone Encounter (Signed)
Requested medication (s) are due for refill today: yes  Requested medication (s) are on the active medication list: yes  Last refill:  07/15/21  Future visit scheduled: no  Notes to clinic:  was seen yesterday, 08/12/21, labs ordered but not drawn, labs are from 04/24/2020, please assess.  Requested Prescriptions  Pending Prescriptions Disp Refills   lisinopril (ZESTRIL) 5 MG tablet [Pharmacy Med Name: LISINOPRIL 5 MG TABLET] 30 tablet 0    Sig: Take 1 tablet (5 mg total) by mouth daily. NEEDS OFFICE VISIT     Cardiovascular:  ACE Inhibitors Failed - 08/13/2021  1:40 AM      Failed - Cr in normal range and within 180 days    Creatinine, Ser  Date Value Ref Range Status  04/24/2020 1.07 0.76 - 1.27 mg/dL Final          Failed - K in normal range and within 180 days    Potassium  Date Value Ref Range Status  04/24/2020 3.9 3.5 - 5.2 mmol/L Final          Passed - Patient is not pregnant      Passed - Last BP in normal range    BP Readings from Last 1 Encounters:  08/12/21 136/81          Passed - Valid encounter within last 6 months    Recent Outpatient Visits           Yesterday Annual physical exam   [facility] Malva Limes, MD   1 year ago Annual physical exam   [facility] Chrismon, Jodell Cipro, PA-C   2 years ago Annual physical exam   [facility], Jodell Cipro, PA-C   3 years ago Annual physical exam   [facility], Jodell Cipro, PA-C   3 years ago Dysuria   [facility] [place], [place], [place]

## 2021-08-15 DIAGNOSIS — E7800 Pure hypercholesterolemia, unspecified: Secondary | ICD-10-CM | POA: Diagnosis not present

## 2021-08-15 DIAGNOSIS — Z125 Encounter for screening for malignant neoplasm of prostate: Secondary | ICD-10-CM | POA: Diagnosis not present

## 2021-08-16 LAB — LIPID PANEL
Chol/HDL Ratio: 4.3 ratio (ref 0.0–5.0)
Cholesterol, Total: 137 mg/dL (ref 100–199)
HDL: 32 mg/dL — ABNORMAL LOW (ref >39)
LDL Chol Calc (NIH): 85 mg/dL (ref 0–99)
Triglycerides: 108 mg/dL (ref 0–149)
VLDL Cholesterol Cal: 20 mg/dL (ref 5–40)

## 2021-08-16 LAB — CBC
Hematocrit: 48.5 % (ref 37.5–51.0)
Hemoglobin: 16.3 g/dL (ref 13.0–17.7)
MCH: 28.8 pg (ref 26.6–33.0)
MCHC: 33.6 g/dL (ref 31.5–35.7)
MCV: 86 fL (ref 79–97)
Platelets: 237 10*3/uL (ref 150–450)
RBC: 5.65 x10E6/uL (ref 4.14–5.80)
RDW: 12.5 % (ref 11.6–15.4)
WBC: 4.4 10*3/uL (ref 3.4–10.8)

## 2021-08-16 LAB — COMPREHENSIVE METABOLIC PANEL
ALT: 34 IU/L (ref 0–44)
AST: 23 IU/L (ref 0–40)
Albumin/Globulin Ratio: 2 (ref 1.2–2.2)
Albumin: 4.5 g/dL (ref 4.0–5.0)
Alkaline Phosphatase: 62 IU/L (ref 44–121)
BUN/Creatinine Ratio: 15 (ref 9–20)
BUN: 16 mg/dL (ref 6–24)
Bilirubin Total: 0.6 mg/dL (ref 0.0–1.2)
CO2: 24 mmol/L (ref 20–29)
Calcium: 9.3 mg/dL (ref 8.7–10.2)
Chloride: 104 mmol/L (ref 96–106)
Creatinine, Ser: 1.06 mg/dL (ref 0.76–1.27)
Globulin, Total: 2.3 g/dL (ref 1.5–4.5)
Glucose: 116 mg/dL — ABNORMAL HIGH (ref 70–99)
Potassium: 4.3 mmol/L (ref 3.5–5.2)
Sodium: 142 mmol/L (ref 134–144)
Total Protein: 6.8 g/dL (ref 6.0–8.5)
eGFR: 85 mL/min/{1.73_m2} (ref >59)

## 2021-08-16 LAB — PSA TOTAL (REFLEX TO FREE): Prostate Specific Ag, Serum: 1.9 ng/mL (ref 0.0–4.0)

## 2021-08-23 DIAGNOSIS — M7542 Impingement syndrome of left shoulder: Secondary | ICD-10-CM | POA: Diagnosis not present

## 2021-09-04 ENCOUNTER — Other Ambulatory Visit: Payer: Self-pay | Admitting: Family Medicine

## 2022-02-05 ENCOUNTER — Other Ambulatory Visit: Payer: Self-pay | Admitting: Family Medicine

## 2022-02-05 DIAGNOSIS — I10 Essential (primary) hypertension: Secondary | ICD-10-CM

## 2022-08-17 ENCOUNTER — Other Ambulatory Visit: Payer: Self-pay | Admitting: Family Medicine

## 2022-08-17 DIAGNOSIS — I10 Essential (primary) hypertension: Secondary | ICD-10-CM

## 2022-08-18 NOTE — Telephone Encounter (Signed)
Requested medication (s) are due for refill today - yes  Requested medication (s) are on the active medication list -yes  Future visit scheduled -no  Last refill: 02/05/22 #30 5RF  Notes to clinic: Attempted to call patient to schedule appointment- left message to call office. Fails OV protocol, lab protocol-over 1 year  Requested Prescriptions  Pending Prescriptions Disp Refills   lisinopril (ZESTRIL) 5 MG tablet [Pharmacy Med Name: LISINOPRIL 5 MG TABLET] 30 tablet 5    Sig: TAKE 1 TABLET (5 MG TOTAL) BY MOUTH DAILY.     Cardiovascular:  ACE Inhibitors Failed - 08/17/2022  1:20 AM      Failed - Cr in normal range and within 180 days    Creatinine, Ser  Date Value Ref Range Status  08/15/2021 1.06 0.76 - 1.27 mg/dL Final         Failed - K in normal range and within 180 days    Potassium  Date Value Ref Range Status  08/15/2021 4.3 3.5 - 5.2 mmol/L Final         Failed - Valid encounter within last 6 months    Recent Outpatient Visits           1 year ago Annual physical exam   [facility] Birdie Sons, MD   2 years ago Annual physical exam   [place], PA-C   3 years ago Annual physical exam   [place], PA-C   4 years ago Annual physical exam   [facility], Vickki Muff, PA-C   4 years ago [place], [place], [place] - Patient is not pregnant      Passed - Last BP in normal range    BP Readings from Last 1 Encounters:  08/12/21 136/81            Requested Prescriptions  Pending Prescriptions Disp Refills   lisinopril (ZESTRIL) 5 MG tablet [Pharmacy Med Name: LISINOPRIL 5 MG TABLET] 30 tablet 5    Sig: TAKE 1 TABLET (5 MG TOTAL) BY MOUTH DAILY.     Cardiovascular:  ACE Inhibitors Failed - 08/17/2022  1:20 AM      Failed - Cr in normal range and within 180 days    Creatinine, Ser  Date Value  Ref Range Status  08/15/2021 1.06 0.76 - 1.27 mg/dL Final         Failed - K in normal range and within 180 days    Potassium  Date Value Ref Range Status  08/15/2021 4.3 3.5 - 5.2 mmol/L Final         Failed - Valid encounter within last 6 months    Recent Outpatient Visits           1 year ago Annual physical exam   [facility] Birdie Sons, MD   2 years ago Annual physical exam   [place], PA-C   3 years ago Annual physical exam   [facility], Vickki Muff, PA-C   4 years ago Annual physical exam   [facility], Vickki Muff, PA-C   4 years ago [place], [place], [place] - Patient is not pregnant  Passed - Last BP in normal range    BP Readings from Last 1 Encounters:  08/12/21 136/81

## 2022-09-02 ENCOUNTER — Other Ambulatory Visit: Payer: Self-pay | Admitting: Family Medicine

## 2022-09-02 DIAGNOSIS — I10 Essential (primary) hypertension: Secondary | ICD-10-CM

## 2022-09-02 NOTE — Telephone Encounter (Signed)
Requested Prescriptions  Pending Prescriptions Disp Refills   lisinopril (ZESTRIL) 5 MG tablet [Pharmacy Med Name: LISINOPRIL 5 MG TABLET] 60 tablet 0    Sig: TAKE 1 TABLET (5 MG TOTAL) BY MOUTH DAILY. PLEASE SCHEDULE OFFICE VISIT BEFORE ANY FUTURE REFILL.     Cardiovascular:  ACE Inhibitors Failed - 09/02/2022 11:03 AM      Failed - Cr in normal range and within 180 days    Creatinine, Ser  Date Value Ref Range Status  08/15/2021 1.06 0.76 - 1.27 mg/dL Final         Failed - K in normal range and within 180 days    Potassium  Date Value Ref Range Status  08/15/2021 4.3 3.5 - 5.2 mmol/L Final         Failed - Valid encounter within last 6 months    Recent Outpatient Visits           1 year ago Annual physical exam   [place] Birdie Sons, MD   2 years ago Annual physical exam   [place] Chrismon, Vickki Muff, PA-C   3 years ago Annual physical exam   [place] Chrismon, Vickki Muff, PA-C   4 years ago Annual physical exam   [place], Vickki Muff, PA-C   4 years ago [place], [place]       Future Appointments             In 2 months Caryn Section, Kirstie Peri, MD [facility], [place] - Patient is not pregnant      Passed - Last BP in normal range    BP Readings from Last 1 Encounters:  08/12/21 136/81

## 2022-10-10 ENCOUNTER — Other Ambulatory Visit: Payer: Self-pay | Admitting: Family Medicine

## 2022-10-10 DIAGNOSIS — I10 Essential (primary) hypertension: Secondary | ICD-10-CM

## 2022-10-10 NOTE — Telephone Encounter (Signed)
Requested medication (s) are due for refill today: yes  Requested medication (s) are on the active medication list: yes  Last refill:  09/02/22  Future visit scheduled: no  Notes to clinic:  Unable to refill per protocol, courtesy refill already given, routing for provider approval.      Requested Prescriptions  Pending Prescriptions Disp Refills   lisinopril (ZESTRIL) 5 MG tablet [Pharmacy Med Name: LISINOPRIL 5 MG TABLET] 90 tablet 1    Sig: TAKE 1 TABLET (5 MG TOTAL) BY MOUTH DAILY. PLEASE SCHEDULE OFFICE VISIT BEFORE ANY FUTURE REFILL.     Cardiovascular:  ACE Inhibitors Failed - 10/10/2022  1:31 PM      Failed - Cr in normal range and within 180 days    Creatinine, Ser  Date Value Ref Range Status  08/15/2021 1.06 0.76 - 1.27 mg/dL Final         Failed - K in normal range and within 180 days    Potassium  Date Value Ref Range Status  08/15/2021 4.3 3.5 - 5.2 mmol/L Final         Failed - Valid encounter within last 6 months    Recent Outpatient Visits           1 year ago Annual physical exam   [place] Birdie Sons, MD   2 years ago Annual physical exam   [place] Chrismon, Vickki Muff, PA-C   3 years ago Annual physical exam   [place] Chrismon, Vickki Muff, PA-C   4 years ago Annual physical exam   [place], Vickki Muff, PA-C   4 years ago [place], [place]       Future Appointments             In 4 weeks Caryn Section, Kirstie Peri, MD [facility], [place] - Patient is not pregnant      Passed - Last BP in normal range    BP Readings from Last 1 Encounters:  08/12/21 136/81

## 2022-10-13 NOTE — Telephone Encounter (Signed)
Please advise 

## 2022-11-07 ENCOUNTER — Encounter: Payer: BC Managed Care – PPO | Admitting: Family Medicine

## 2022-12-01 NOTE — Progress Notes (Unsigned)
Vivien Rota DeSanto,acting as a scribe for Mila Merry, MD.,have documented all relevant documentation on the behalf of Mila Merry, MD,as directed by  Mila Merry, MD while in the presence of Mila Merry, MD.    Complete physical exam   Patient: Austin [employer]   DOB: 1970/10/03   52 y.o. Male  MRN: [id number] Visit Date: 12/02/2022  Today's healthcare provider: Mila Merry, MD   No chief complaint on file.  Subjective    Austin Taborda Perleberg Montez Hageman. is a 52 y.o. male who presents today for a complete physical exam.  He reports consuming a {diet types:17450} diet. {Exercise:19826} He generally feels {well/fairly well/poorly:18703}. He reports sleeping {well/fairly well/poorly:18703}. He {does/does not:200015} have additional problems to discuss today.  HPI  ***  Past Medical History:  Diagnosis Date   Hyperlipidemia    Hypertension    Past Surgical History:  Procedure Laterality Date   EXTRACORPOREAL SHOCK WAVE LITHOTRIPSY Right 01/14/2018   Procedure: EXTRACORPOREAL SHOCK WAVE LITHOTRIPSY (ESWL);  Surgeon: Riki Altes, MD;  Location: [facility] ORS;  Service: Urology;  Laterality: Right;   NO PAST SURGERIES     Social History   Socioeconomic History   Marital status: Married    Spouse name: Not on file   Number of children: Not on file   Years of education: Not on file   Highest education level: Not on file  Occupational History   Not on file  Tobacco Use   Smoking status: Never   Smokeless tobacco: Never  Vaping Use   Vaping Use: Never used  Substance and Sexual Activity   Alcohol use: Yes    Alcohol/week: 0.0 standard drinks of alcohol    Comment: occasionally   Drug use: No   Sexual activity: Yes  Other Topics Concern   Not on file  Social History Narrative   Not on file   Social Determinants of Health   Financial Resource Strain: Not on file  Food Insecurity: Not on file  Transportation Needs: Not on file  Physical Activity: Not on  file  Stress: Not on file  Social Connections: Not on file  Intimate Partner Violence: Not on file   Family Status  Relation Name Status   Mother  Alive   Father  Deceased   Sister  Alive   MGM  Alive   MGF  Alive   PGM  Deceased   Neg Hx  (Not Specified)   Family History  Problem Relation Age of Onset   Breast cancer Mother        history of   Hyperlipidemia Father    Hypertension Father    Pancreatitis Father    Heart disease Father    Healthy Sister    CVA Maternal Grandmother    CAD Maternal Grandfather    Ovarian cancer Paternal Grandmother    Colon cancer Neg Hx    No Known Allergies  Patient Care Team: Malva Limes, MD as PCP - General (Family Medicine) Stanton Kidney, MD as Consulting Physician (Gastroenterology)   Medications: Outpatient Medications Prior to Visit  Medication Sig   atorvastatin (LIPITOR) 40 MG tablet TAKE 1 TABLET BY MOUTH EVERY DAY   lisinopril (ZESTRIL) 5 MG tablet Take 1 tablet (5 mg total) by mouth daily. Needs office visit   No facility-administered medications prior to visit.    Review of Systems  Constitutional: Negative.   HENT: Negative.    Eyes: Negative.   Respiratory: Negative.    Cardiovascular: Negative.  Gastrointestinal: Negative.   Endocrine: Negative.   Genitourinary: Negative.   Musculoskeletal: Negative.   Skin: Negative.   Allergic/Immunologic: Negative.   Neurological: Negative.   Hematological: Negative.   Psychiatric/Behavioral: Negative.      {Labs  Heme  Chem  Endocrine  Serology  Results Review (optional):23779}  Objective    There were no vitals taken for this visit. {Show previous vital signs (optional):23777}   Physical Exam  ***  Last depression screening scores    08/12/2021    1:51 PM 04/24/2020    9:00 AM 03/10/2019    1:42 PM  PHQ 2/9 Scores  PHQ - 2 Score 0 0 0  PHQ- 9 Score 0 3    Last fall risk screening    08/12/2021    1:51 PM  Fall Risk   Falls in the past  year? 0  Number falls in past yr: 0  Injury with Fall? 0  Risk for fall due to : No Fall Risks  Follow up Falls evaluation completed   Last Audit-C alcohol use screening    08/12/2021    1:50 PM  Alcohol Use Disorder Test (AUDIT)  1. How often do you have a drink containing alcohol? 2  2. How many drinks containing alcohol do you have on a typical day when you are drinking? 0  3. How often do you have six or more drinks on one occasion? 0  AUDIT-C Score 2   A score of 3 or more in women, and 4 or more in men indicates increased risk for alcohol abuse, EXCEPT if all of the points are from question 1   No results found for any visits on 12/02/22.  Assessment & Plan    Routine Health Maintenance and Physical Exam  Exercise Activities and Dietary recommendations  Goals   None     Immunization History  Administered Date(s) Administered   PFIZER(Purple Top)SARS-COV-2 Vaccination 11/21/2019, 12/13/2019   Tdap 10/14/2005, 09/13/2015   Zoster Recombinat (Shingrix) 08/12/2021    Health Maintenance  Topic Date Due   Zoster Vaccines- Shingrix (2 of 2) 10/07/2021   COVID-19 Vaccine (3 - 2023-24 season) 04/04/2022   INFLUENZA VACCINE  03/05/2023   DTaP/Tdap/Td (3 - Td or Tdap) 09/12/2025   COLONOSCOPY (Pts 45-71yrs Insurance coverage will need to be confirmed)  04/19/2028   Hepatitis C Screening  Completed   HIV Screening  Completed   HPV VACCINES  Aged Out    Discussed health benefits of physical activity, and encouraged him to engage in regular exercise appropriate for his age and condition.  ***  No follow-ups on file.     {provider attestation***:1}   Mila Merry, MD  [facility] [phone] (phone) [phone] (fax)  [facility]

## 2022-12-02 ENCOUNTER — Ambulatory Visit: Payer: BC Managed Care – PPO | Admitting: Family Medicine

## 2022-12-02 ENCOUNTER — Telehealth: Payer: Self-pay | Admitting: Family Medicine

## 2022-12-02 ENCOUNTER — Ambulatory Visit (INDEPENDENT_AMBULATORY_CARE_PROVIDER_SITE_OTHER): Payer: BC Managed Care – PPO | Admitting: Family Medicine

## 2022-12-02 VITALS — BP 123/88 | HR 70 | Temp 98.0°F | Ht 67.0 in | Wt 211.0 lb

## 2022-12-02 DIAGNOSIS — E7800 Pure hypercholesterolemia, unspecified: Secondary | ICD-10-CM

## 2022-12-02 DIAGNOSIS — H9313 Tinnitus, bilateral: Secondary | ICD-10-CM

## 2022-12-02 DIAGNOSIS — Z23 Encounter for immunization: Secondary | ICD-10-CM

## 2022-12-02 DIAGNOSIS — I10 Essential (primary) hypertension: Secondary | ICD-10-CM

## 2022-12-02 DIAGNOSIS — Z0000 Encounter for general adult medical examination without abnormal findings: Secondary | ICD-10-CM | POA: Diagnosis not present

## 2022-12-02 DIAGNOSIS — Z125 Encounter for screening for malignant neoplasm of prostate: Secondary | ICD-10-CM

## 2022-12-02 NOTE — Telephone Encounter (Signed)
Meant for this patient to have his shingles vaccine before he left but forgot. He is coming in tomorrow morning for labs. Please check with patient and see if he can stop by to get shingles vaccine when he comes in to get labs drawn. Can double book him on schedule.

## 2022-12-02 NOTE — Patient Instructions (Signed)
.   Please review the attached list of medications and notify my office if there are any errors.   . Please bring all of your medications to every appointment so we can make sure that our medication list is the same as yours.   

## 2022-12-03 ENCOUNTER — Ambulatory Visit (INDEPENDENT_AMBULATORY_CARE_PROVIDER_SITE_OTHER): Payer: BC Managed Care – PPO | Admitting: Family Medicine

## 2022-12-03 DIAGNOSIS — Z23 Encounter for immunization: Secondary | ICD-10-CM

## 2022-12-03 DIAGNOSIS — Z0000 Encounter for general adult medical examination without abnormal findings: Secondary | ICD-10-CM | POA: Diagnosis not present

## 2022-12-03 DIAGNOSIS — Z125 Encounter for screening for malignant neoplasm of prostate: Secondary | ICD-10-CM | POA: Diagnosis not present

## 2022-12-03 DIAGNOSIS — E7800 Pure hypercholesterolemia, unspecified: Secondary | ICD-10-CM | POA: Diagnosis not present

## 2022-12-03 DIAGNOSIS — I10 Essential (primary) hypertension: Secondary | ICD-10-CM | POA: Diagnosis not present

## 2022-12-03 NOTE — Progress Notes (Signed)
Vaccine administration only. No E&M service today.   

## 2022-12-04 LAB — CBC WITH DIFFERENTIAL/PLATELET
Basophils Absolute: 0 10*3/uL (ref 0.0–0.2)
Basos: 0 %
EOS (ABSOLUTE): 0.1 10*3/uL (ref 0.0–0.4)
Eos: 2 %
Hematocrit: 48.4 % (ref 37.5–51.0)
Hemoglobin: 15.8 g/dL (ref 13.0–17.7)
Immature Grans (Abs): 0 10*3/uL (ref 0.0–0.1)
Immature Granulocytes: 0 %
Lymphocytes Absolute: 1.7 10*3/uL (ref 0.7–3.1)
Lymphs: 30 %
MCH: 27.9 pg (ref 26.6–33.0)
MCHC: 32.6 g/dL (ref 31.5–35.7)
MCV: 86 fL (ref 79–97)
Monocytes Absolute: 0.6 10*3/uL (ref 0.1–0.9)
Monocytes: 10 %
Neutrophils Absolute: 3.3 10*3/uL (ref 1.4–7.0)
Neutrophils: 58 %
Platelets: 296 10*3/uL (ref 150–450)
RBC: 5.66 x10E6/uL (ref 4.14–5.80)
RDW: 12.3 % (ref 11.6–15.4)
WBC: 5.7 10*3/uL (ref 3.4–10.8)

## 2022-12-04 LAB — COMPREHENSIVE METABOLIC PANEL
ALT: 36 IU/L (ref 0–44)
AST: 28 IU/L (ref 0–40)
Albumin/Globulin Ratio: 1.6 (ref 1.2–2.2)
Albumin: 4.3 g/dL (ref 3.8–4.9)
Alkaline Phosphatase: 65 IU/L (ref 44–121)
BUN/Creatinine Ratio: 11 (ref 9–20)
BUN: 13 mg/dL (ref 6–24)
Bilirubin Total: 0.5 mg/dL (ref 0.0–1.2)
CO2: 21 mmol/L (ref 20–29)
Calcium: 9.6 mg/dL (ref 8.7–10.2)
Chloride: 102 mmol/L (ref 96–106)
Creatinine, Ser: 1.14 mg/dL (ref 0.76–1.27)
Globulin, Total: 2.7 g/dL (ref 1.5–4.5)
Glucose: 116 mg/dL — ABNORMAL HIGH (ref 70–99)
Potassium: 4.3 mmol/L (ref 3.5–5.2)
Sodium: 135 mmol/L (ref 134–144)
Total Protein: 7 g/dL (ref 6.0–8.5)
eGFR: 77 mL/min/{1.73_m2} (ref >59)

## 2022-12-04 LAB — TSH: TSH: 2.7 u[IU]/mL (ref 0.450–4.500)

## 2022-12-04 LAB — LIPID PANEL WITH LDL/HDL RATIO
Cholesterol, Total: 160 mg/dL (ref 100–199)
HDL: 36 mg/dL — ABNORMAL LOW (ref >39)
LDL Chol Calc (NIH): 106 mg/dL — ABNORMAL HIGH (ref 0–99)
LDL/HDL Ratio: 2.9 ratio (ref 0.0–3.6)
Triglycerides: 99 mg/dL (ref 0–149)
VLDL Cholesterol Cal: 18 mg/dL (ref 5–40)

## 2022-12-04 LAB — PSA: Prostate Specific Ag, Serum: 1.6 ng/mL (ref 0.0–4.0)

## 2023-12-04 ENCOUNTER — Encounter: Payer: Self-pay | Admitting: Family Medicine

## 2024-03-22 LAB — COLOGUARD: COLOGUARD: NEGATIVE

## 2024-08-07 ENCOUNTER — Other Ambulatory Visit: Payer: Self-pay | Admitting: Family Medicine

## 2024-08-07 DIAGNOSIS — I10 Essential (primary) hypertension: Secondary | ICD-10-CM
